# Patient Record
Sex: Male | Born: 1990 | Hispanic: No | Marital: Married | Smoking: Never smoker
Health system: Southern US, Community
[De-identification: ages and names within clinical notes are randomized; demographics above are authoritative.]

## PROBLEM LIST (undated history)

## (undated) DIAGNOSIS — U071 COVID-19: Secondary | ICD-10-CM

## (undated) DIAGNOSIS — R002 Palpitations: Secondary | ICD-10-CM

## (undated) DIAGNOSIS — R7303 Prediabetes: Secondary | ICD-10-CM

## (undated) DIAGNOSIS — R079 Chest pain, unspecified: Secondary | ICD-10-CM

## (undated) HISTORY — DX: Palpitations: R00.2

## (undated) HISTORY — DX: Chest pain, unspecified: R07.9

## (undated) HISTORY — PX: NO PAST SURGERIES: SHX2092

## (undated) HISTORY — DX: Prediabetes: R73.03

---

## 2019-11-29 ENCOUNTER — Other Ambulatory Visit: Payer: Self-pay

## 2019-11-29 ENCOUNTER — Encounter (HOSPITAL_COMMUNITY): Payer: Self-pay

## 2019-11-29 ENCOUNTER — Emergency Department (HOSPITAL_COMMUNITY)
Admission: EM | Admit: 2019-11-29 | Discharge: 2019-11-29 | Disposition: A | Discharge status: Home / Self Care | Payer: Managed Care, Other (non HMO) | Attending: Emergency Medicine | Admitting: Emergency Medicine

## 2019-11-29 DIAGNOSIS — Y939 Activity, unspecified: Secondary | ICD-10-CM | POA: Diagnosis not present

## 2019-11-29 DIAGNOSIS — Y92410 Unspecified street and highway as the place of occurrence of the external cause: Secondary | ICD-10-CM | POA: Insufficient documentation

## 2019-11-29 DIAGNOSIS — S161XXA Strain of muscle, fascia and tendon at neck level, initial encounter: Secondary | ICD-10-CM | POA: Diagnosis not present

## 2019-11-29 DIAGNOSIS — Y999 Unspecified external cause status: Secondary | ICD-10-CM | POA: Insufficient documentation

## 2019-11-29 DIAGNOSIS — S199XXA Unspecified injury of neck, initial encounter: Secondary | ICD-10-CM | POA: Diagnosis present

## 2019-11-29 MED ORDER — CYCLOBENZAPRINE HCL 10 MG PO TABS
10.0000 mg | ORAL_TABLET | Freq: Two times a day (BID) | ORAL | 0 refills | Status: DC | PRN
Start: 2019-11-29 — End: 2020-03-14

## 2019-11-29 NOTE — Discharge Instructions (Signed)
Please read instructions below. °Apply ice to your areas of pain for 20 minutes at a time. °You can take 600 mg of Advil/ibuprofen every 6 hours as needed for pain. °You can take flexeril every 12 hours as needed for muscle spasm. Be aware this medication can make you drowsy. °Schedule an appointment with your primary care provider to follow up on your visit today. °Return to the ER for severely worsening headache, vision changes, if new numbness or weakness in your arms or legs, inability to urinate, inability to hold your bowels, or concerning symptoms. ° °
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
## 2020-01-18 ENCOUNTER — Ambulatory Visit: Payer: Self-pay | Admitting: *Deleted

## 2020-01-18 NOTE — Telephone Encounter (Signed)
Pt's wife calling, pt present. States dx with covid Saturday. Reports lightheadedness when standing. States BP, HR SAT "All normal." Reports lightheadedness mild, "After he was out mowing." Discussed S/S dehydration, symptom tier, when to go to ED. Care advise given per protocl. Verbalizes understanding.  Reason for Disposition . [1] COVID-19 diagnosed by positive lab test AND [2] mild symptoms (e.g., cough, fever, others) AND [3] no complications or SOB  Answer Assessment - Initial Assessment Questions 1. COVID-19 DIAGNOSIS: "Who made your Coronavirus (COVID-19) diagnosis?" "Was it confirmed by a positive lab test?" If not diagnosed by a HCP, ask "Are there lots of cases (community spread) where you live?" (See public health department website, if unsure)     *No Answer* 2. COVID-19 EXPOSURE: "Was there any known exposure to COVID before the symptoms began?" CDC Definition of close contact: within 6 feet (2 meters) for a total of 15 minutes or more over a 24-hour period.      *No Answer* 3. ONSET: "When did the COVID-19 symptoms start?"      *No Answer* 4. WORST SYMPTOM: "What is your worst symptom?" (e.g., cough, fever, shortness of breath, muscle aches)     *No Answer* 5. COUGH: "Do you have a cough?" If Yes, ask: "How bad is the cough?"       *No Answer* 6. FEVER: "Do you have a fever?" If Yes, ask: "What is your temperature, how was it measured, and when did it start?"     *No Answer* 7. RESPIRATORY STATUS: "Describe your breathing?" (e.g., shortness of breath, wheezing, unable to speak)      *No Answer* 8. BETTER-SAME-WORSE: "Are you getting better, staying the same or getting worse compared to yesterday?"  If getting worse, ask, "In what way?"     *No Answer* 9. HIGH RISK DISEASE: "Do you have any chronic medical problems?" (e.g., asthma, heart or lung disease, weak immune system, obesity, etc.)     *No Answer* 10. PREGNANCY: "Is there any chance you are pregnant?" "When was your last  menstrual period?"       *No Answer* 11. OTHER SYMPTOMS: "Do you have any other symptoms?"  (e.g., chills, fatigue, headache, loss of smell or taste, muscle pain, sore throat; new loss of smell or taste especially support the diagnosis of COVID-19)       *No Answer*  Protocols used: CORONAVIRUS (COVID-19) DIAGNOSED OR SUSPECTED-A-AH

## 2020-01-22 ENCOUNTER — Other Ambulatory Visit: Payer: Self-pay

## 2020-01-22 ENCOUNTER — Emergency Department (HOSPITAL_COMMUNITY)
Admission: EM | Admit: 2020-01-22 | Discharge: 2020-01-23 | Disposition: A | Discharge status: Home / Self Care | Payer: Managed Care, Other (non HMO) | Attending: Emergency Medicine | Admitting: Emergency Medicine

## 2020-01-22 DIAGNOSIS — R0602 Shortness of breath: Secondary | ICD-10-CM | POA: Diagnosis present

## 2020-01-22 DIAGNOSIS — U071 COVID-19: Secondary | ICD-10-CM | POA: Diagnosis not present

## 2020-01-22 DIAGNOSIS — R002 Palpitations: Secondary | ICD-10-CM

## 2020-01-22 NOTE — ED Triage Notes (Signed)
PT  Robert Copeland he was dx with covid last Wednesday and tonight he felt like his heart was  racing. No SOB. Pt said he did smoke tonight and felt his heart beating fast. No SOB. Some chest pressure, no fevers, no cough

## 2020-01-23 ENCOUNTER — Emergency Department (HOSPITAL_COMMUNITY): Payer: Managed Care, Other (non HMO)

## 2020-01-23 LAB — D-DIMER, QUANTITATIVE: D-Dimer, Quant: <0.27 ug/mL-FEU (ref 0.00–0.50)

## 2020-01-23 LAB — CBC
HCT: 51.1 % (ref 39.0–52.0)
Hemoglobin: 16.2 g/dL (ref 13.0–17.0)
MCH: 27 pg (ref 26.0–34.0)
MCHC: 31.7 g/dL (ref 30.0–36.0)
MCV: 85 fL (ref 80.0–100.0)
Platelets: 233 10*3/uL (ref 150–400)
RBC: 6.01 MIL/uL — ABNORMAL HIGH (ref 4.22–5.81)
RDW: 12.9 % (ref 11.5–15.5)
WBC: 8.8 10*3/uL (ref 4.0–10.5)
nRBC: 0 % (ref 0.0–0.2)

## 2020-01-23 LAB — BASIC METABOLIC PANEL
Anion gap: 12 (ref 5–15)
BUN: 7 mg/dL (ref 6–20)
CO2: 26 mmol/L (ref 22–32)
Calcium: 8.9 mg/dL (ref 8.9–10.3)
Chloride: 95 mmol/L — ABNORMAL LOW (ref 98–111)
Creatinine, Ser: 1.45 mg/dL — ABNORMAL HIGH (ref 0.61–1.24)
GFR calc Af Amer: >60 mL/min (ref >60)
GFR calc non Af Amer: >60 mL/min (ref >60)
Glucose, Bld: 160 mg/dL — ABNORMAL HIGH (ref 70–99)
Potassium: 3.3 mmol/L — ABNORMAL LOW (ref 3.5–5.1)
Sodium: 133 mmol/L — ABNORMAL LOW (ref 135–145)

## 2020-01-23 LAB — TROPONIN I (HIGH SENSITIVITY)
Troponin I (High Sensitivity): 6 ng/L (ref <18)
Troponin I (High Sensitivity): 6 ng/L (ref <18)

## 2020-01-23 MED ORDER — KETOROLAC TROMETHAMINE 30 MG/ML IJ SOLN: 30.0000 mg | Freq: Once | INTRAMUSCULAR | Status: DC

## 2020-01-23 MED ORDER — SODIUM CHLORIDE 0.9 % IV BOLUS
1000.0000 mL | Freq: Once | INTRAVENOUS | Status: AC
  Administered 2020-01-23: 1000 mL via INTRAVENOUS

## 2020-01-23 NOTE — ED Provider Notes (Addendum)
[facility] EMERGENCY DEPARTMENT Provider Note   CSN: [id number] Arrival date & time: 01/22/20  2332     History Chief Complaint  Patient presents with  . Shortness of Breath    COVID +  . Chest Pain    Robert Copeland is a 29 y.o. male.  HPI   tested positive for covid on the 26th of August - unvaccinated No cough / sob / cp n/v/d. 2 episodes of "low grade fever" - took OTC's. No myalgias / weakness / abd pain. He has some constipation  Presents today after smoking MJ last night - had some palpitations - has resolved at this point.  Had R sided pressure on the chest.  palpitations have resolved.  Patient states that he will no longer smoke marijuana, he states that he has had a decreased appetite since being diagnosed with Covid approximately 10 days ago, he has lost approximately 16 pounds during that timeframe.  He is now eating again over the last couple of days but while in the waiting room had very little, only water    No past medical history on file.  There are no problems to display for this patient.   No past surgical history on file.     No family history on file.  Social History   Tobacco Use  . Smoking status: Not on file  Substance Use Topics  . Alcohol use: Not on file  . Drug use: Not on file    Home Medications Prior to Admission medications   Medication Sig Start Date End Date Taking? Authorizing Provider  cyclobenzaprine (FLEXERIL) 10 MG tablet Take 1 tablet (10 mg total) by mouth 2 (two) times daily as needed for muscle spasms. 11/29/19   Robinson, [place] N, PA-C    Allergies    Patient has no allergy information on record.  Review of Systems   Review of Systems  All other systems reviewed and are negative.   Physical Exam Updated Vital Signs BP 131/82   Pulse 81   Temp 98.8 F (37.1 C) (Oral)   Resp 16   SpO2 100%   Physical Exam Vitals and nursing note reviewed.  Constitutional:      General: He is not  in acute distress.    Appearance: He is well-developed.  HENT:     Head: Normocephalic and atraumatic.     Mouth/Throat:     Pharynx: No oropharyngeal exudate.  Eyes:     General: No scleral icterus.       Right eye: No discharge.        Left eye: No discharge.     Conjunctiva/sclera: Conjunctivae normal.     Pupils: Pupils are equal, round, and reactive to light.  Neck:     Thyroid: No thyromegaly.     Vascular: No JVD.  Cardiovascular:     Rate and Rhythm: Regular rhythm. Tachycardia present.     Heart sounds: Normal heart sounds. No murmur heard.  No friction rub. No gallop.      Comments: 105 ish Pulmonary:     Effort: Pulmonary effort is normal. No respiratory distress.     Breath sounds: Normal breath sounds. No wheezing or rales.  Chest:     Chest wall: No tenderness.  Abdominal:     General: Bowel sounds are normal. There is no distension.     Palpations: Abdomen is soft. There is no mass.     Tenderness: There is no abdominal tenderness.  Musculoskeletal:  General: No tenderness. Normal range of motion.     Cervical back: Normal range of motion and neck supple.     Right lower leg: No edema.     Left lower leg: No edema.  Lymphadenopathy:     Cervical: No cervical adenopathy.  Skin:    General: Skin is warm and dry.     Findings: No erythema or rash.  Neurological:     Mental Status: He is alert.     Coordination: Coordination normal.  Psychiatric:        Behavior: Behavior normal.     ED Results / Procedures / Treatments   Labs (all labs ordered are listed, but only abnormal results are displayed) Labs Reviewed  BASIC METABOLIC PANEL - Abnormal; Notable for the following components:      Result Value   Sodium 133 (*)    Potassium 3.3 (*)    Chloride 95 (*)    Glucose, Bld 160 (*)    Creatinine, Ser 1.45 (*)    All other components within normal limits  CBC - Abnormal; Notable for the following components:   RBC 6.01 (*)    All other  components within normal limits  D-DIMER, QUANTITATIVE (NOT AT [facility])  TROPONIN I (HIGH SENSITIVITY)  TROPONIN I (HIGH SENSITIVITY)    EKG EKG Interpretation  Date/Time:  Sunday January 23 2020 00:02:58 EDT Ventricular Rate:  108 PR Interval:  164 QRS Duration: 84 QT Interval:  324 QTC Calculation: 434 R Axis:   52 Text Interpretation: Sinus tachycardia T wave abnormality, consider inferior ischemia Abnormal ECG no old tracing to compare Confirmed by Eber Hong ([id number]) on 01/23/2020 11:00:05 AM   EKG Interpretation  Date/Time:  Sunday January 23 2020 11:54:21 EDT Ventricular Rate:  81 PR Interval:  164 QRS Duration: 85 QT Interval:  369 QTC Calculation: 429 R Axis:   52 Text Interpretation: Sinus rhythm Borderline T abnormalities, inferior leads Borderline ST elevation, anterior leads Since last tracing rate slower repolarization changes seen on EKG Confirmed by Eber Hong ([id number]) on 01/23/2020 12:02:33 PM        Radiology DG Chest 2 View  Result Date: 01/23/2020 CLINICAL DATA:  Chest pain EXAM: CHEST - 2 VIEW COMPARISON:  None. FINDINGS: The heart size and mediastinal contours are within normal limits. Both lungs are clear. The visualized skeletal structures are unremarkable. IMPRESSION: Normal study. Electronically Signed   By: Charlett Nose M.D.   On: 01/23/2020 00:34    Procedures .Marland KitchenLaceration Repair  Date/Time: 01/23/2020 1:28 PM Performed by: Eber Hong, MD Authorized by: Eber Hong, MD   Comments:       Marland KitchenMarland KitchenLaceration Repair  Date/Time: 01/23/2020 1:29 PM Performed by: Eber Hong, MD Authorized by: Eber Hong, MD   Comments:         (including critical care time)  Medications Ordered in ED Medications  sodium chloride 0.9 % bolus 1,000 mL (1,000 mLs Intravenous New Bag/Given 01/23/20 1331)    ED Course  I have reviewed the triage vital signs and the nursing notes.  Pertinent labs & imaging results that were available during my care of  the patient were reviewed by me and considered in my medical decision making (see chart for details).    MDM Rules/Calculators/A&P                          The patient's exam is rather unremarkable, he is not tachycardic on my exam, his lab work has  been reviewed and shows that he does have a slight renal insufficiency with a creatinine of 1.45, slight electrolyte abnormalities.  His blood sugar is elevated and should not be given his lack of sugar intake while he has been here.  We will proceed with an IV fluids, he will need to follow-up in the outpatient setting, he has an abnormal EKG which we will repeat but no signs of tacky arrhythmias at this time.  His significant other states that his heart rate was approximately 165 as seen on a pulse ox machine which she correlated with palpating his heart rate clinically.  He will most certainly need follow-up with cardiology given the EKG and the palpitations though I do not see any signs of a accessory pathway or tacky arrhythmia on the EKG.  He has been made aware of all of his lab abnormalities, I have instructed him to find a family doctor and have his labs repeated including a fasting glucose.  Given the slight change in ST segments there could be an element of pericarditis though there does not appear to be any signs of acute ischemia.  Again the patient will need to be referred to cardiology.  Second EKG repeated has significant improvement in the abnormal T waves, the diffuse ST changes especially through the precordium which do not appear ischemic do suggest a possible element of pericarditis or just an early repol.  IVF given, pt stable for d/c.   To be clear this patient did not have laceration repairs, the electronic medical record did not allow me to remove these procedure notes which were accidentally entered on the wrong chart.  Information was deleted, this patient should not receive a charge for laceration repairs  Final Clinical  Impression(s) / ED Diagnoses Final diagnoses:  Palpitations      Eber Hong, MD 01/23/20 1342    Eber Hong, MD 01/23/20 1435

## 2020-01-23 NOTE — Discharge Instructions (Addendum)
Your testing has been reassuring, it does appear that you are a little bit dehydrated, please see the phone number on the paperwork to arrange a follow-up appointment with a local family doctor.  You should also be seen by a heart doctor since you are having the palpitations or feeling like your heart was racing.  I would recommend that you see them within the week  Return to the emergency department immediately for severe worsening symptoms  Please avoid any substances that contain caffeine or other stimulants, this may include smoking marijuana, tobacco, alcohol or over-the-counter products such as antihistamines, allergy medicines, cough medications or cough and cold products.

## 2020-01-28 ENCOUNTER — Encounter (HOSPITAL_COMMUNITY): Payer: Self-pay

## 2020-01-28 ENCOUNTER — Ambulatory Visit (HOSPITAL_COMMUNITY)
Admission: EM | Admit: 2020-01-28 | Discharge: 2020-01-28 | Disposition: A | Discharge status: Home / Self Care | Payer: Managed Care, Other (non HMO) | Attending: Physician Assistant | Admitting: Physician Assistant

## 2020-01-28 ENCOUNTER — Other Ambulatory Visit: Payer: Self-pay

## 2020-01-28 DIAGNOSIS — R0981 Nasal congestion: Secondary | ICD-10-CM

## 2020-01-28 DIAGNOSIS — R079 Chest pain, unspecified: Secondary | ICD-10-CM | POA: Diagnosis not present

## 2020-01-28 MED ORDER — FLUTICASONE PROPIONATE 50 MCG/ACT NA SUSP
1.0000 | Freq: Every day | NASAL | 2 refills | Status: DC
Start: 2020-01-28 — End: 2020-03-14

## 2020-01-28 MED ORDER — FAMOTIDINE 20 MG PO TABS
20.0000 mg | ORAL_TABLET | Freq: Two times a day (BID) | ORAL | 0 refills | Status: DC
Start: 2020-01-28 — End: 2020-03-14

## 2020-01-28 NOTE — ED Triage Notes (Signed)
Patient presents to Urgent Care with complaints of generalized chest pressure since about 10 days ago. Patient reports he had covid recently, at the end of his quarantine the chest pain worried him so much he went to the ED for evaluation. States it feels like a gas bubble comes up from his stomach and burns.  Pt states it makes him feel like is blood pressure is rising, no hx of htn. Pt states the pain first started 10 days ago after smoking some weed while on a walk down the street.

## 2020-01-28 NOTE — ED Provider Notes (Signed)
[facility]    CSN: [id number] Arrival date & time: 01/28/20  1533      History   Chief Complaint Chief Complaint  Patient presents with  . Chest Pain    HPI Robert Copeland is a 29 y.o. male.   Patient reports for right-sided chest pressure/discomfort reports this is been going on for several days to over a week now.  He reports this all started after he had recovered from Covid.  He reports he went to the emergency department on 01/22/2020 for same.  He reports he had smoked some marijuana and started to feel his heart racing and developed some chest pressure.  He reports they worked him up and did not find anything.  He reports since then he has continued to have this "bubble" pressure in the right side of his chest.  States it feels like bloating and then he might need to belch but has not belched.  Occasionally worse when laying down reports occasionally feeling somewhat nauseous with this but no vomiting.  He reports he is unsure whether food makes it better or worse, water made it feel different today.  He denies radiation of this into his left chest or neck or jaw.  He denies any shortness of breath.  He reports he has not felt palpitations in a few days.  No lightheadedness or dizziness noted.  Denies any abdominal pain.  Moving his bowels, does note having possible issues with constipation try MiraLAX for this week.  Moving his bowels better now.  Had a bowel movement yesterday.  Denies any fevers.  No coughing or other respiratory symptoms.  States he has pending appointments for primary care and cardiology.   States he developed some nasal congestion.  He was worried about what medicines he could use given his symptoms.     History reviewed. No pertinent past medical history.  There are no problems to display for this patient.   History reviewed. No pertinent surgical history.     Home Medications    Prior to Admission medications   Medication Sig Start  Date End Date Taking? Authorizing Provider  cyclobenzaprine (FLEXERIL) 10 MG tablet Take 1 tablet (10 mg total) by mouth 2 (two) times daily as needed for muscle spasms. 11/29/19   Robinson, [place] N, PA-C  famotidine (PEPCID) 20 MG tablet Take 1 tablet (20 mg total) by mouth 2 (two) times daily. 01/28/20   Reznor Ferrando, Veryl Speak, PA-C  fluticasone (FLONASE) 50 MCG/ACT nasal spray Place 1 spray into both nostrils daily. 01/28/20   Alverda Nazzaro, Veryl Speak, PA-C    Family History Family History  Problem Relation Age of Onset  . Healthy Mother   . Healthy Father     Social History Social History   Tobacco Use  . Smoking status: Never Smoker  . Smokeless tobacco: Never Used  Substance Use Topics  . Alcohol use: Yes  . Drug use: Yes    Types: Marijuana     Allergies   Patient has no known allergies.   Review of Systems Review of Systems   Physical Exam Triage Vital Signs ED Triage Vitals  Enc Vitals Group     BP 01/28/20 1648 (!) 142/87     Pulse Rate 01/28/20 1648 79     Resp 01/28/20 1648 16     Temp 01/28/20 1648 98.8 F (37.1 C)     Temp Source 01/28/20 1648 Oral     SpO2 01/28/20 1648 99 %  Weight --      Height --      Head Circumference --      Peak Flow --      Pain Score 01/28/20 1645 4     Pain Loc --      Pain Edu? --      Excl. in GC? --    No data found.  Updated Vital Signs BP (!) 142/87 (BP Location: Right Arm)   Pulse 79   Temp 98.8 F (37.1 C) (Oral)   Resp 16   SpO2 99%   Visual Acuity Right Eye Distance:   Left Eye Distance:   Bilateral Distance:    Right Eye Near:   Left Eye Near:    Bilateral Near:     Physical Exam Vitals and nursing note reviewed.  Constitutional:      General: He is not in acute distress.    Appearance: He is well-developed. He is not ill-appearing.  HENT:     Head: Normocephalic and atraumatic.     Nose: Congestion present.     Mouth/Throat:     Mouth: Mucous membranes are moist.     Pharynx: Oropharynx is clear.    Eyes:     Conjunctiva/sclera: Conjunctivae normal.  Cardiovascular:     Rate and Rhythm: Normal rate and regular rhythm.     Heart sounds: No murmur heard.   Pulmonary:     Effort: Pulmonary effort is normal. No respiratory distress.     Breath sounds: Normal breath sounds. No decreased breath sounds, wheezing, rhonchi or rales.  Chest:     Chest wall: No tenderness.  Abdominal:     Palpations: Abdomen is soft.     Tenderness: There is no abdominal tenderness.  Musculoskeletal:     Cervical back: Neck supple.     Right lower leg: No edema.     Left lower leg: No edema.  Skin:    General: Skin is warm and dry.  Neurological:     Mental Status: He is alert.      UC Treatments / Results  Labs (all labs ordered are listed, but only abnormal results are displayed) Labs Reviewed - No data to display  EKG   Radiology No results found.  Procedures Procedures (including critical care time)  Medications Ordered in UC Medications - No data to display  Initial Impression / Assessment and Plan / UC Course  I have reviewed the triage vital signs and the nursing notes.  Pertinent labs & imaging results that were available during my care of the patient were reviewed by me and considered in my medical decision making (see chart for details).     #Nonspecific chest pain #Nasal congestion Patient is a 29 year old presenting with nonspecific chest discomfort and nasal congestion.  Reassuring work-up in the emergency department on 01/23/2020.  Reassuring EKG, chest x-ray, troponins, negative D-dimer.  Mild electrolyte abnormalities, thought to not be causing symptoms.  Patient has a regular rhythm on exam today.  I do wonder if there is some aspect of reflux given describes symptoms will trial Pepcid.  Given patient's recent Covid, would not retest today.  Recommend Flonase for nasal congestion.  Discussed strict emergency department precautions.  Encouraged him to contact primary care  office in cardiology office to see if he can be seen sooner.  Has cardiology follow-up on 02/24/2019 when scheduled.  Primary care follow-up October 15 of encouraged to attempt earlier follow-up.  Patient verbalized agreement understanding plan of care. Final  Clinical Impressions(s) / UC Diagnoses   Final diagnoses:  Nonspecific chest pain  Nasal congestion     Discharge Instructions     I am not entirely sure what is causing your symptoms, but you had a reassuring work up at the ER and I do not see dangerous things today  Try the pepcid Use flonase  Call your Primary care Monday for close follow up  If symptoms become severe again with fast heart rate, shortness of breath, dizziness go to the Emergency department     ED Prescriptions    Medication Sig Dispense Auth. Provider   famotidine (PEPCID) 20 MG tablet Take 1 tablet (20 mg total) by mouth 2 (two) times daily. 30 tablet Salina Stanfield, Veryl Speak, PA-C   fluticasone (FLONASE) 50 MCG/ACT nasal spray Place 1 spray into both nostrils daily. 15.8 mL Sevannah Madia, Veryl Speak, PA-C     PDMP not reviewed this encounter.   Hermelinda Medicus, PA-C 01/28/20 2321

## 2020-01-28 NOTE — Discharge Instructions (Signed)
I am not entirely sure what is causing your symptoms, but you had a reassuring work up at the ER and I do not see dangerous things today  Try the pepcid Use flonase  Call your Primary care Monday for close follow up  If symptoms become severe again with fast heart rate, shortness of breath, dizziness go to the Emergency department

## 2020-02-03 NOTE — Progress Notes (Signed)
Electrophysiology Office Note:    Date:  02/04/2020   ID:  Robert Copeland, DOB [birth year], MRN [id number]  PCP:  Patient, No Pcp Per  [facility] Cardiologist:  No primary care provider on file.  [facility] Electrophysiologist:  None   Referring MD: No ref. provider found   Chief Complaint: Pericarditic pain  History of Present Illness:    Robert Copeland is a 29 y.o. male who presents for an evaluation of chest pain at the request of Cam Hai, PA-C . He recently had covid in the end of August. Since then, he had chest pain that is sharp and sometimes radiates to the back and shoulder blade. It is not always present but has been severe enough to prompt an ER and urgent care visit. No syncope or presyncope.  Past Medical History:  Diagnosis Date  . Chest pain   . Palpitations     No past surgical history on file.  Current Medications: Current Meds  Medication Sig  . cyclobenzaprine (FLEXERIL) 10 MG tablet Take 1 tablet (10 mg total) by mouth 2 (two) times daily as needed for muscle spasms.  . famotidine (PEPCID) 20 MG tablet Take 1 tablet (20 mg total) by mouth 2 (two) times daily.  . fluticasone (FLONASE) 50 MCG/ACT nasal spray Place 1 spray into both nostrils daily.     Allergies:   Patient has no known allergies.   Social History   Socioeconomic History  . Marital status: Married    Spouse name: Not on file  . Number of children: Not on file  . Years of education: Not on file  . Highest education level: Not on file  Occupational History  . Not on file  Tobacco Use  . Smoking status: Never Smoker  . Smokeless tobacco: Never Used  Substance and Sexual Activity  . Alcohol use: Yes  . Drug use: Yes    Types: Marijuana  . Sexual activity: Not on file  Other Topics Concern  . Not on file  Social History Narrative  . Not on file   Social Determinants of Health   Financial Resource Strain:   . Difficulty of Paying Living Expenses: Not on file  Food  Insecurity:   . Worried About Programme researcher, broadcasting/film/video in the Last Year: Not on file  . Ran Out of Food in the Last Year: Not on file  Transportation Needs:   . Lack of Transportation (Medical): Not on file  . Lack of Transportation (Non-Medical): Not on file  Physical Activity:   . Days of Exercise per Week: Not on file  . Minutes of Exercise per Session: Not on file  Stress:   . Feeling of Stress : Not on file  Social Connections:   . Frequency of Communication with Friends and Family: Not on file  . Frequency of Social Gatherings with Friends and Family: Not on file  . Attends Religious Services: Not on file  . Active Member of Clubs or Organizations: Not on file  . Attends Banker Meetings: Not on file  . Marital Status: Not on file     Family History: The patient's family history includes Healthy in his father and mother.  ROS:   Please see the history of present illness.    All other systems reviewed and are negative.  EKGs/Labs/Other Studies Reviewed:    The following studies were reviewed today: ECG  EKG:  The ekg ordered today demonstrates sinus rhythm.   Recent Labs: 01/23/2020:  BUN 7; Creatinine, Ser 1.45; Hemoglobin 16.2; Platelets 233; Potassium 3.3; Sodium 133  Recent Lipid Panel No results found for: CHOL, TRIG, HDL, CHOLHDL, VLDL, LDLCALC, LDLDIRECT  Physical Exam:    VS:  BP 126/74   Pulse (!) 102   Ht 5\' 7"  (1.702 m)   Wt 184 lb (83.5 kg)   SpO2 96%   BMI 28.82 kg/m     Wt Readings from Last 3 Encounters:  02/04/20 184 lb (83.5 kg)     GEN:  Well nourished, well developed in no acute distress HEENT: Normal NECK: No JVD; No carotid bruits LYMPHATICS: No lymphadenopathy CARDIAC: RRR, no murmurs, rubs, gallops. RESPIRATORY:  Clear to auscultation without rales, wheezing or rhonchi  ABDOMEN: Soft, non-tender, non-distended MUSCULOSKELETAL:  No edema; No deformity  SKIN: Warm and dry NEUROLOGIC:  Alert and oriented x 3 PSYCHIATRIC:   Normal affect   ASSESSMENT:    1. Viral pericarditis, unspecified chronicity    PLAN:    In order of problems listed above:  1. Viral pericarditis Likely related to covid19 infection. I have recommended he take ibuprofen 600mg  twice daily with food for 7 days. I am also ordering a surface echo to confirm no structural abnormalities or significant effusion. Follow up 6 weeks.   Medication Adjustments/Labs and Tests Ordered: Current medicines are reviewed at length with the patient today.  Concerns regarding medicines are outlined above.  Orders Placed This Encounter  Procedures  . EKG 12-Lead  . ECHOCARDIOGRAM COMPLETE   Meds ordered this encounter  Medications  . Ibuprofen 200 MG CAPS    Sig: Take 3 capsules (600 mg total) by mouth in the morning and at bedtime for 7 days.    Dispense:  42 capsule    Refill:  0     Signed, 02/06/20, MD, [facility]  02/04/2020 3:40 PM    Electrophysiology [facility]

## 2020-02-04 ENCOUNTER — Ambulatory Visit (INDEPENDENT_AMBULATORY_CARE_PROVIDER_SITE_OTHER): Payer: Managed Care, Other (non HMO) | Admitting: Cardiology

## 2020-02-04 ENCOUNTER — Other Ambulatory Visit: Payer: Self-pay

## 2020-02-04 VITALS — BP 126/74 | HR 102 | Ht 67.0 in | Wt 184.0 lb

## 2020-02-04 DIAGNOSIS — B3323 Viral pericarditis: Secondary | ICD-10-CM

## 2020-02-04 DIAGNOSIS — R002 Palpitations: Secondary | ICD-10-CM | POA: Insufficient documentation

## 2020-02-04 MED ORDER — IBUPROFEN 200 MG PO CAPS: 600.0000 mg | ORAL_CAPSULE | Freq: Two times a day (BID) | ORAL | 0 refills | Status: AC

## 2020-02-04 NOTE — Patient Instructions (Addendum)
Medication Instructions:  Your physician has recommended you make the following change in your medication:   1.  Take IBUPROFEN 200 mg capsules-  Take 3 capsules (600 mg) by mouth twice a day for 7 days.  Lab Work: None ordered. If you have labs (blood work) drawn today and your tests are completely normal, you will receive your results only by: Marland Kitchen MyChart Message (if you have MyChart) OR . A paper copy in the mail If you have any lab test that is abnormal or we need to change your treatment, we will call you to review the results.  Testing/Procedures: Your physician has requested that you have an echocardiogram. Echocardiography is a painless test that uses sound waves to create images of your heart. It provides your doctor with information about the size and shape of your heart and how well your heart's chambers and valves are working. This procedure takes approximately one hour. There are no restrictions for this procedure.  Please schedule echo   Follow-Up: At [facility], you and your health needs are our priority.  As part of our continuing mission to provide you with exceptional heart care, we have created designated Provider Care Teams.  These Care Teams include your primary Cardiologist (physician) and Advanced Practice Providers (APPs -  Physician Assistants and Nurse Practitioners) who all work together to provide you with the care you need, when you need it.  Your next appointment:   Your physician wants you to follow-up in: 6 weeks with Dr. Lalla Brothers.

## 2020-02-16 ENCOUNTER — Ambulatory Visit: Payer: Managed Care, Other (non HMO) | Admitting: Cardiology

## 2020-02-23 ENCOUNTER — Other Ambulatory Visit: Payer: Self-pay

## 2020-02-23 ENCOUNTER — Ambulatory Visit (HOSPITAL_COMMUNITY): Payer: Managed Care, Other (non HMO) | Attending: Cardiovascular Disease

## 2020-02-23 DIAGNOSIS — B3323 Viral pericarditis: Secondary | ICD-10-CM | POA: Diagnosis not present

## 2020-02-23 LAB — ECHOCARDIOGRAM COMPLETE
Area-P 1/2: 5.06 cm2
S' Lateral: 3.2 cm

## 2020-02-24 ENCOUNTER — Ambulatory Visit: Payer: Managed Care, Other (non HMO) | Admitting: Cardiology

## 2020-03-03 ENCOUNTER — Other Ambulatory Visit: Payer: Self-pay

## 2020-03-03 ENCOUNTER — Ambulatory Visit (INDEPENDENT_AMBULATORY_CARE_PROVIDER_SITE_OTHER): Payer: Managed Care, Other (non HMO) | Admitting: Registered Nurse

## 2020-03-03 ENCOUNTER — Encounter: Payer: Self-pay | Admitting: Registered Nurse

## 2020-03-03 VITALS — BP 123/84 | HR 83 | Temp 98.5°F | Resp 18 | Ht 67.0 in | Wt 187.4 lb

## 2020-03-03 DIAGNOSIS — Z0000 Encounter for general adult medical examination without abnormal findings: Secondary | ICD-10-CM

## 2020-03-03 DIAGNOSIS — Z13228 Encounter for screening for other metabolic disorders: Secondary | ICD-10-CM | POA: Diagnosis not present

## 2020-03-03 DIAGNOSIS — Z7689 Persons encountering health services in other specified circumstances: Secondary | ICD-10-CM

## 2020-03-03 DIAGNOSIS — Z1329 Encounter for screening for other suspected endocrine disorder: Secondary | ICD-10-CM | POA: Diagnosis not present

## 2020-03-03 DIAGNOSIS — Z13220 Encounter for screening for lipoid disorders: Secondary | ICD-10-CM | POA: Diagnosis not present

## 2020-03-03 DIAGNOSIS — Z130 Encounter for screening for diseases of the blood and blood-forming organs and certain disorders involving the immune mechanism: Secondary | ICD-10-CM

## 2020-03-03 LAB — POCT GLYCOSYLATED HEMOGLOBIN (HGB A1C): Hemoglobin A1C: 5.8 % — AB (ref 4.0–5.6)

## 2020-03-03 NOTE — Patient Instructions (Signed)
° ° ° °  If you have lab work done today you will be contacted with your lab results within the next 2 weeks.  If you have not heard from us then please contact us. The fastest way to get your results is to register for My Chart. ° ° °IF you received an x-ray today, you will receive an invoice from [facility]. Please contact [facility] at [phone] with questions or concerns regarding your invoice.  ° °IF you received labwork today, you will receive an invoice from [facility]. Please contact [facility] at [phone] with questions or concerns regarding your invoice.  ° °Our billing staff will not be able to assist you with questions regarding bills from these companies. ° °You will be contacted with the lab results as soon as they are available. The fastest way to get your results is to activate your My Chart account. Instructions are located on the last page of this paperwork. If you have not heard from us regarding the results in 2 weeks, please contact this office. °  ° ° ° °

## 2020-03-04 LAB — COMPREHENSIVE METABOLIC PANEL
ALT: 40 IU/L (ref 0–44)
AST: 23 IU/L (ref 0–40)
Albumin/Globulin Ratio: 1.8 (ref 1.2–2.2)
Albumin: 4.6 g/dL (ref 4.1–5.2)
Alkaline Phosphatase: 53 IU/L (ref 44–121)
BUN/Creatinine Ratio: 8 — ABNORMAL LOW (ref 9–20)
BUN: 8 mg/dL (ref 6–20)
Bilirubin Total: 0.3 mg/dL (ref 0.0–1.2)
CO2: 22 mmol/L (ref 20–29)
Calcium: 9.2 mg/dL (ref 8.7–10.2)
Chloride: 102 mmol/L (ref 96–106)
Creatinine, Ser: 1.02 mg/dL (ref 0.76–1.27)
GFR calc Af Amer: 115 mL/min/{1.73_m2} (ref >59)
GFR calc non Af Amer: 100 mL/min/{1.73_m2} (ref >59)
Globulin, Total: 2.6 g/dL (ref 1.5–4.5)
Glucose: 97 mg/dL (ref 65–99)
Potassium: 4.7 mmol/L (ref 3.5–5.2)
Sodium: 135 mmol/L (ref 134–144)
Total Protein: 7.2 g/dL (ref 6.0–8.5)

## 2020-03-04 LAB — CBC WITH DIFFERENTIAL
Basophils Absolute: 0.1 10*3/uL (ref 0.0–0.2)
Basos: 1 %
EOS (ABSOLUTE): 0.3 10*3/uL (ref 0.0–0.4)
Eos: 5 %
Hematocrit: 46.9 % (ref 37.5–51.0)
Hemoglobin: 15.5 g/dL (ref 13.0–17.7)
Immature Grans (Abs): 0 10*3/uL (ref 0.0–0.1)
Immature Granulocytes: 0 %
Lymphocytes Absolute: 2.7 10*3/uL (ref 0.7–3.1)
Lymphs: 46 %
MCH: 27.7 pg (ref 26.6–33.0)
MCHC: 33 g/dL (ref 31.5–35.7)
MCV: 84 fL (ref 79–97)
Monocytes Absolute: 0.5 10*3/uL (ref 0.1–0.9)
Monocytes: 8 %
Neutrophils Absolute: 2.4 10*3/uL (ref 1.4–7.0)
Neutrophils: 40 %
RBC: 5.59 x10E6/uL (ref 4.14–5.80)
RDW: 13.1 % (ref 11.6–15.4)
WBC: 5.9 10*3/uL (ref 3.4–10.8)

## 2020-03-04 LAB — LIPID PANEL
Chol/HDL Ratio: 3.1 ratio (ref 0.0–5.0)
Cholesterol, Total: 165 mg/dL (ref 100–199)
HDL: 54 mg/dL (ref >39)
LDL Chol Calc (NIH): 100 mg/dL — ABNORMAL HIGH (ref 0–99)
Triglycerides: 56 mg/dL (ref 0–149)
VLDL Cholesterol Cal: 11 mg/dL (ref 5–40)

## 2020-03-04 LAB — TSH: TSH: 1.07 u[IU]/mL (ref 0.450–4.500)

## 2020-03-06 ENCOUNTER — Other Ambulatory Visit: Payer: Self-pay | Admitting: Registered Nurse

## 2020-03-06 NOTE — Telephone Encounter (Signed)
Medication Refill - Medication:   Pantoprazole (Patient recently had appointment and was advised by pcp that medication would be sent to pharmacy. Patient contacted pharmacy and medication has not been sent over.)   Has the patient contacted their pharmacy?yes (Agent: If no, request that the patient contact the pharmacy for the refill.) (Agent: If yes, when and what did the pharmacy advise?)Contact PCP  Preferred Pharmacy (with phone number or street name):  [facility] [address] [place]), [place] - [id number] Phone:  [phone]  Fax:  [phone]       Agent: Please be advised that RX refills may take up to 3 business days. We ask that you follow-up with your pharmacy.

## 2020-03-06 NOTE — Telephone Encounter (Signed)
Please send pantoprazole if applicable per pt request per recent visit.

## 2020-03-10 NOTE — Telephone Encounter (Signed)
IS this medication okay to send?

## 2020-03-10 NOTE — Telephone Encounter (Signed)
Patient is calling back regarding  this request  ° °Please advise  °

## 2020-03-13 MED ORDER — PANTOPRAZOLE SODIUM 40 MG PO TBEC
40.0000 mg | DELAYED_RELEASE_TABLET | Freq: Every day | ORAL | 3 refills | Status: DC
Start: 2020-03-13 — End: 2021-03-02

## 2020-03-13 NOTE — Telephone Encounter (Signed)
Rx pended. Please confirm and sign if that is the correct dose you want pt to have.

## 2020-03-13 NOTE — Telephone Encounter (Signed)
Ok to send - disp 90 with 3 refills  Thanks  Jari Sportsman, NP

## 2020-03-14 ENCOUNTER — Encounter: Payer: Self-pay | Admitting: Cardiology

## 2020-03-14 ENCOUNTER — Ambulatory Visit (INDEPENDENT_AMBULATORY_CARE_PROVIDER_SITE_OTHER): Payer: Managed Care, Other (non HMO) | Admitting: Cardiology

## 2020-03-14 ENCOUNTER — Other Ambulatory Visit: Payer: Self-pay

## 2020-03-14 VITALS — BP 122/74 | HR 75 | Ht 66.0 in | Wt 197.0 lb

## 2020-03-14 DIAGNOSIS — B3323 Viral pericarditis: Secondary | ICD-10-CM

## 2020-03-14 NOTE — Progress Notes (Signed)
Electrophysiology Office Follow up Visit Note:    Date:  03/14/2020   ID:  Robert Copeland, DOB 1991-01-09, MRN [id number]  PCP:  Janeece Agee, NP  [facility] Cardiologist:  No primary care provider on file.  [facility] Electrophysiologist:  Lanier Prude, MD    Interval History:    Robert Copeland is a 29 y.o. male who presents for a follow up visit. They were last seen in clinic February 04, 2020 for pericardial chest pain.  He was treated with a 7-day course of ibuprofen and the symptoms resolved.  He tells me he has not had further episodes of this chest discomfort.   Past Medical History:  Diagnosis Date   Chest pain    Palpitations     History reviewed. No pertinent surgical history.  Current Medications: Current Meds  Medication Sig   pantoprazole (PROTONIX) 40 MG tablet Take 1 tablet (40 mg total) by mouth daily.     Allergies:   Patient has no known allergies.   Social History   Socioeconomic History   Marital status: Married    Spouse name: Not on file   Number of children: Not on file   Years of education: Not on file   Highest education level: Not on file  Occupational History   Not on file  Tobacco Use   Smoking status: Former Smoker   Smokeless tobacco: Never Used  Substance and Sexual Activity   Alcohol use: Not Currently   Drug use: Not Currently   Sexual activity: Not on file  Other Topics Concern   Not on file  Social History Narrative   Not on file   Social Determinants of Health   Financial Resource Strain:    Difficulty of Paying Living Expenses: Not on file  Food Insecurity:    Worried About Running Out of Food in the Last Year: Not on file   Ran Out of Food in the Last Year: Not on file  Transportation Needs:    Lack of Transportation (Medical): Not on file   Lack of Transportation (Non-Medical): Not on file  Physical Activity:    Days of Exercise per Week: Not on file   Minutes of  Exercise per Session: Not on file  Stress:    Feeling of Stress : Not on file  Social Connections:    Frequency of Communication with Friends and Family: Not on file   Frequency of Social Gatherings with Friends and Family: Not on file   Attends Religious Services: Not on file   Active Member of Clubs or Organizations: Not on file   Attends Banker Meetings: Not on file   Marital Status: Not on file     Family History: The patient's family history includes Healthy in his father and mother; Hypertension in his mother.  ROS:   Please see the history of present illness.    All other systems reviewed and are negative.  EKGs/Labs/Other Studies Reviewed:    The following studies were reviewed today: Echo  February 23, 2020 echo personally reviewed Left ventricular function normal No significant valvular abnormalities Trivial pericardial effusion  EKG:  The ekg ordered today demonstrates sinus rhythm and early repolarization  Recent Labs: 01/23/2020: Platelets 233 03/03/2020: ALT 40; BUN 8; Creatinine, Ser 1.02; Hemoglobin 15.5; Potassium 4.7; Sodium 135; TSH 1.070  Recent Lipid Panel    Component Value Date/Time   CHOL 165 03/03/2020 1119   TRIG 56 03/03/2020 1119   HDL 54 03/03/2020 1119  CHOLHDL 3.1 03/03/2020 1119   LDLCALC 100 (H) 03/03/2020 1119    Physical Exam:    VS:  BP 122/74    Pulse 75    Ht 5\' 6"  (1.676 m)    Wt 197 lb (89.4 kg)    SpO2 97%    BMI 31.80 kg/m     Wt Readings from Last 3 Encounters:  03/14/20 197 lb (89.4 kg)  03/03/20 187 lb 6.4 oz (85 kg)  02/04/20 184 lb (83.5 kg)     GEN:  Well nourished, well developed in no acute distress HEENT: Normal NECK: No JVD; No carotid bruits LYMPHATICS: No lymphadenopathy CARDIAC: RRR, no murmurs, rubs, gallops RESPIRATORY:  Clear to auscultation without rales, wheezing or rhonchi  ABDOMEN: Soft, non-tender, non-distended MUSCULOSKELETAL:  No edema; No deformity  SKIN: Warm and  dry NEUROLOGIC:  Alert and oriented x 3 PSYCHIATRIC:  Normal affect   ASSESSMENT:    1. Viral pericarditis, unspecified chronicity    PLAN:    In order of problems listed above:  1. Pericardial chest pain Now resolved after treatment with ibuprofen.  Echo shows no structural heart disease or significant effusion.  Plan to follow-up as needed.   Medication Adjustments/Labs and Tests Ordered: Current medicines are reviewed at length with the patient today.  Concerns regarding medicines are outlined above.  Orders Placed This Encounter  Procedures   EKG 12-Lead   No orders of the defined types were placed in this encounter.    Signed, 02/06/20, MD, [facility]  03/14/2020 8:41 AM    Electrophysiology [facility]

## 2020-03-14 NOTE — Patient Instructions (Addendum)
Medication Instructions:  Your physician recommends that you continue on your current medications as directed. Please refer to the Current Medication list given to you today.  *If you need a refill on your cardiac medications before your next appointment, please call your pharmacy*  Lab Work: None ordered.  If you have labs (blood work) drawn today and your tests are completely normal, you will receive your results only by: . MyChart Message (if you have MyChart) OR . A paper copy in the mail If you have any lab test that is abnormal or we need to change your treatment, we will call you to review the results.  Testing/Procedures: None ordered.  Follow-Up: At [facility], you and your health needs are our priority.  As part of our continuing mission to provide you with exceptional heart care, we have created designated Provider Care Teams.  These Care Teams include your primary Cardiologist (physician) and Advanced Practice Providers (APPs -  Physician Assistants and Nurse Practitioners) who all work together to provide you with the care you need, when you need it.  We recommend signing up for the patient portal called "MyChart".  Sign up information is provided on this After Visit Summary.  MyChart is used to connect with patients for Virtual Visits (Telemedicine).  Patients are able to view lab/test results, encounter notes, upcoming appointments, etc.  Non-urgent messages can be sent to your provider as well.   To learn more about what you can do with MyChart, go to [email or web address].    Your next appointment:   Your physician wants you to follow-up in: As needed.    Other Instructions:  

## 2020-04-10 ENCOUNTER — Emergency Department (HOSPITAL_COMMUNITY)
Admission: EM | Admit: 2020-04-10 | Discharge: 2020-04-10 | Disposition: A | Discharge status: Home / Self Care | Payer: Managed Care, Other (non HMO) | Attending: Emergency Medicine | Admitting: Emergency Medicine

## 2020-04-10 ENCOUNTER — Encounter (HOSPITAL_COMMUNITY): Payer: Self-pay

## 2020-04-10 ENCOUNTER — Other Ambulatory Visit: Payer: Self-pay

## 2020-04-10 DIAGNOSIS — Z8616 Personal history of COVID-19: Secondary | ICD-10-CM | POA: Diagnosis not present

## 2020-04-10 DIAGNOSIS — Z87891 Personal history of nicotine dependence: Secondary | ICD-10-CM | POA: Insufficient documentation

## 2020-04-10 DIAGNOSIS — M545 Low back pain, unspecified: Secondary | ICD-10-CM | POA: Diagnosis not present

## 2020-04-10 DIAGNOSIS — M549 Dorsalgia, unspecified: Secondary | ICD-10-CM | POA: Diagnosis present

## 2020-04-10 HISTORY — DX: COVID-19: U07.1

## 2020-04-10 MED ORDER — KETOROLAC TROMETHAMINE 60 MG/2ML IM SOLN
60.0000 mg | Freq: Once | INTRAMUSCULAR | Status: DC
  Filled 2020-04-10: qty 2

## 2020-04-10 MED ORDER — METHOCARBAMOL 500 MG PO TABS
500.0000 mg | ORAL_TABLET | Freq: Two times a day (BID) | ORAL | 0 refills | Status: DC
Start: 2020-04-10 — End: 2021-03-02
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
## 2020-04-10 NOTE — Discharge Instructions (Signed)
Take NSAIDs or Tylenol as needed for the next week. Take this medicine with food. Take muscle relaxer at bedtime to help you sleep. This medicine makes you drowsy so do not take before driving or work Use a heating pad for sore muscles - use for 20 minutes several times a day Try gentle range of motion exercises Return for worsening symptoms  

## 2020-05-01 ENCOUNTER — Encounter: Payer: Self-pay | Admitting: Registered Nurse

## 2020-05-01 NOTE — Progress Notes (Signed)
New Patient Office Visit  Subjective:  Patient ID: Robert Copeland, male    DOB: 02-25-1991  Age: 29 y.o. MRN: [id number]  CC:  Chief Complaint  Patient presents with   New Patient (Initial Visit)    Patient states he is here to Establish care and get an CPE.    HPI Robert Copeland presents for visit to est care and CPE  Histories reviewed with patient, updated as warranted  No acute concerns today. Last CPE some time ago Interested in routine blood work.  Past Medical History:  Diagnosis Date   Chest pain    COVID    Palpitations     No past surgical history on file.  Family History  Problem Relation Age of Onset   Healthy Mother    Hypertension Mother    Healthy Father     Social History   Socioeconomic History   Marital status: Married    Spouse name: Not on file   Number of children: Not on file   Years of education: Not on file   Highest education level: Not on file  Occupational History   Not on file  Tobacco Use   Smoking status: Former Smoker   Smokeless tobacco: Never Used  Building services engineer Use: Never used  Substance and Sexual Activity   Alcohol use: Not Currently   Drug use: Not Currently   Sexual activity: Not on file  Other Topics Concern   Not on file  Social History Narrative   Not on file   Social Determinants of Health   Financial Resource Strain: Not on file  Food Insecurity: Not on file  Transportation Needs: Not on file  Physical Activity: Not on file  Stress: Not on file  Social Connections: Not on file  Intimate Partner Violence: Not on file    ROS Review of Systems  Constitutional: Negative.   HENT: Negative.   Eyes: Negative.   Respiratory: Negative.   Cardiovascular: Negative.   Gastrointestinal: Negative.   Genitourinary: Negative.   Musculoskeletal: Negative.   Skin: Negative.   Neurological: Negative.   Psychiatric/Behavioral: Negative.     Objective:   Today's Vitals: BP  123/84    Pulse 83    Temp 98.5 F (36.9 C) (Temporal)    Resp 18    Ht 5\' 7"  (1.702 m)    Wt 187 lb 6.4 oz (85 kg)    SpO2 96%    BMI 29.35 kg/m   Physical Exam Vitals and nursing note reviewed.  Constitutional:      General: He is not in acute distress.    Appearance: Normal appearance. He is obese. He is not ill-appearing, toxic-appearing or diaphoretic.  HENT:     Head: Normocephalic and atraumatic.     Right Ear: Tympanic membrane, ear canal and external ear normal. There is no impacted cerumen.     Left Ear: Tympanic membrane, ear canal and external ear normal. There is no impacted cerumen.     Nose: Nose normal. No congestion or rhinorrhea.     Mouth/Throat:     Mouth: Mucous membranes are moist.     Pharynx: Oropharynx is clear. No oropharyngeal exudate or posterior oropharyngeal erythema.  Eyes:     General: No scleral icterus.       Right eye: No discharge.        Left eye: No discharge.     Extraocular Movements: Extraocular movements intact.     Conjunctiva/sclera: Conjunctivae normal.  Pupils: Pupils are equal, round, and reactive to light.  Neck:     Vascular: No carotid bruit.  Cardiovascular:     Rate and Rhythm: Normal rate and regular rhythm.     Pulses: Normal pulses.     Heart sounds: Normal heart sounds. No murmur heard. No friction rub. No gallop.   Pulmonary:     Effort: Pulmonary effort is normal. No respiratory distress.     Breath sounds: Normal breath sounds. No stridor. No wheezing, rhonchi or rales.  Chest:     Chest wall: No tenderness.  Abdominal:     General: Abdomen is flat. Bowel sounds are normal. There is no distension.     Palpations: Abdomen is soft. There is no mass.     Tenderness: There is no abdominal tenderness. There is no right CVA tenderness, left CVA tenderness, guarding or rebound.     Hernia: No hernia is present.  Musculoskeletal:        General: No swelling, tenderness, deformity or signs of injury. Normal range of  motion.     Cervical back: Normal range of motion and neck supple. No rigidity or tenderness.     Right lower leg: No edema.     Left lower leg: No edema.  Lymphadenopathy:     Cervical: No cervical adenopathy.  Skin:    General: Skin is warm and dry.     Capillary Refill: Capillary refill takes less than 2 seconds.     Coloration: Skin is not jaundiced or pale.     Findings: No bruising, erythema, lesion or rash.  Neurological:     General: No focal deficit present.     Mental Status: He is alert and oriented to person, place, and time. Mental status is at baseline.     Cranial Nerves: No cranial nerve deficit.     Sensory: No sensory deficit.     Motor: No weakness.     Coordination: Coordination normal.     Gait: Gait normal.     Deep Tendon Reflexes: Reflexes normal.  Psychiatric:        Mood and Affect: Mood normal.        Behavior: Behavior normal.        Thought Content: Thought content normal.        Judgment: Judgment normal.     Assessment & Plan:   Problem List Items Addressed This Visit   None   Visit Diagnoses    Annual physical exam    -  Primary   Encounter to establish care       Screening for endocrine, metabolic and immunity disorder       Relevant Orders   CBC With Differential (Completed)   Comprehensive metabolic panel (Completed)   TSH (Completed)   POCT glycosylated hemoglobin (Hb A1C) (Completed)   Lipid screening       Relevant Orders   Lipid panel (Completed)      Outpatient Encounter Medications as of 03/03/2020  Medication Sig   [DISCONTINUED] cyclobenzaprine (FLEXERIL) 10 MG tablet Take 1 tablet (10 mg total) by mouth 2 (two) times daily as needed for muscle spasms.   [DISCONTINUED] famotidine (PEPCID) 20 MG tablet Take 1 tablet (20 mg total) by mouth 2 (two) times daily.   [DISCONTINUED] fluticasone (FLONASE) 50 MCG/ACT nasal spray Place 1 spray into both nostrils daily.   No facility-administered encounter medications on file as  of 03/03/2020.    Follow-up: No follow-ups on file.   PLAN  Exam unremarkable  Labs collected. Will follow up with the patient as warranted.  Return in 1 year for cpe w labs  Patient encouraged to call clinic with any questions, comments, or concerns.  Janeece Agee, NP

## 2020-05-05 ENCOUNTER — Telehealth: Payer: Self-pay | Admitting: Cardiology

## 2020-05-05 NOTE — Telephone Encounter (Signed)
Patient said he has had consistent pericarditis pain since his visit with Dr. Lalla Brothers 03/14/20. He has just been taking ibuprofen for the pain but was wondering if there was anything stronger that Dr. Lalla Brothers could put him on.   Patient also states his daughter had a rapid COVID test done today that came back positive. He wanted to know if there was anything Dr. Lalla Brothers could recommend to avoid him having another pericarditis flare up. Please advise

## 2021-02-26 ENCOUNTER — Encounter: Payer: Self-pay | Admitting: Registered Nurse

## 2021-03-01 ENCOUNTER — Ambulatory Visit (INDEPENDENT_AMBULATORY_CARE_PROVIDER_SITE_OTHER): Payer: 59 | Admitting: Registered Nurse

## 2021-03-01 ENCOUNTER — Encounter: Payer: Self-pay | Admitting: Registered Nurse

## 2021-03-01 ENCOUNTER — Other Ambulatory Visit: Payer: Self-pay

## 2021-03-01 VITALS — BP 138/88 | HR 91 | Temp 98.1°F | Resp 20 | Ht 68.0 in | Wt 243.4 lb

## 2021-03-01 DIAGNOSIS — Z0000 Encounter for general adult medical examination without abnormal findings: Secondary | ICD-10-CM

## 2021-03-01 DIAGNOSIS — R5382 Chronic fatigue, unspecified: Secondary | ICD-10-CM | POA: Diagnosis not present

## 2021-03-01 DIAGNOSIS — Z1329 Encounter for screening for other suspected endocrine disorder: Secondary | ICD-10-CM | POA: Diagnosis not present

## 2021-03-01 DIAGNOSIS — Z130 Encounter for screening for diseases of the blood and blood-forming organs and certain disorders involving the immune mechanism: Secondary | ICD-10-CM | POA: Diagnosis not present

## 2021-03-01 DIAGNOSIS — R0681 Apnea, not elsewhere classified: Secondary | ICD-10-CM

## 2021-03-01 DIAGNOSIS — Z13228 Encounter for screening for other metabolic disorders: Secondary | ICD-10-CM

## 2021-03-01 DIAGNOSIS — Z13220 Encounter for screening for lipoid disorders: Secondary | ICD-10-CM | POA: Diagnosis not present

## 2021-03-01 NOTE — Progress Notes (Signed)
Established Patient Office Visit  Subjective:  Patient ID: Robert Copeland, male    DOB: 24-Mar-1991  Age: 30 y.o. MRN: [id number]  CC:  Chief Complaint  Patient presents with   Annual Exam    HPI Robert Copeland presents for CPE  Notes weight gain and fatigue Ongoing for around 1 year Notes snoring when sleeping, partner says he gasps, coughs, chokes during sleep as well.  Long family hx of OSA.  He gets 5-6 hours of sleep a night. Mostly sleeping prone as supine causes him to wake frequently. A lot of daytime somnolence. Feels like he could nap most days at 2-4pm.  Otherwise no symptoms suggesting thyroid dysfunction or anemia to explain fatigue.   Past Medical History:  Diagnosis Date   Chest pain    COVID    Palpitations     History reviewed. No pertinent surgical history.  Family History  Problem Relation Age of Onset   Healthy Mother    Hypertension Mother    Healthy Father     Social History   Socioeconomic History   Marital status: Married    Spouse name: Not on file   Number of children: Not on file   Years of education: Not on file   Highest education level: Not on file  Occupational History   Not on file  Tobacco Use   Smoking status: Former   Smokeless tobacco: Never  Vaping Use   Vaping Use: Never used  Substance and Sexual Activity   Alcohol use: Not Currently   Drug use: Not Currently   Sexual activity: Not on file  Other Topics Concern   Not on file  Social History Narrative   Not on file   Social Determinants of Health   Financial Resource Strain: Not on file  Food Insecurity: Not on file  Transportation Needs: Not on file  Physical Activity: Not on file  Stress: Not on file  Social Connections: Not on file  Intimate Partner Violence: Not on file    Outpatient Medications Prior to Visit  Medication Sig Dispense Refill   methocarbamol (ROBAXIN) 500 MG tablet Take 1 tablet (500 mg total) by mouth 2 (two) times daily.  (Patient not taking: Reported on 03/01/2021) 20 tablet 0   pantoprazole (PROTONIX) 40 MG tablet Take 1 tablet (40 mg total) by mouth daily. (Patient not taking: Reported on 03/01/2021) 90 tablet 3   No facility-administered medications prior to visit.    No Known Allergies  ROS Review of Systems  Constitutional:  Positive for fatigue and unexpected weight change. Negative for activity change, appetite change, chills, diaphoresis and fever.  HENT: Negative.    Eyes: Negative.   Respiratory: Negative.    Cardiovascular: Negative.   Gastrointestinal: Negative.   Genitourinary: Negative.   Musculoskeletal: Negative.   Skin: Negative.   Neurological: Negative.   Psychiatric/Behavioral: Negative.       Objective:    Physical Exam Vitals and nursing note reviewed.  Constitutional:      General: He is not in acute distress.    Appearance: Normal appearance. He is normal weight. He is not ill-appearing, toxic-appearing or diaphoretic.  HENT:     Head: Normocephalic and atraumatic.     Right Ear: Tympanic membrane, ear canal and external ear normal. There is no impacted cerumen.     Left Ear: Tympanic membrane, ear canal and external ear normal. There is no impacted cerumen.     Nose: Nose normal. No congestion or rhinorrhea.  Mouth/Throat:     Mouth: Mucous membranes are moist.     Pharynx: Oropharynx is clear. No oropharyngeal exudate or posterior oropharyngeal erythema.  Eyes:     General: No scleral icterus.       Right eye: No discharge.        Left eye: No discharge.     Extraocular Movements: Extraocular movements intact.     Conjunctiva/sclera: Conjunctivae normal.     Pupils: Pupils are equal, round, and reactive to light.  Neck:     Vascular: No carotid bruit.  Cardiovascular:     Rate and Rhythm: Normal rate and regular rhythm.     Pulses: Normal pulses.     Heart sounds: Normal heart sounds. No murmur heard.   No friction rub. No gallop.  Pulmonary:      Effort: Pulmonary effort is normal. No respiratory distress.     Breath sounds: Normal breath sounds. No stridor. No wheezing, rhonchi or rales.  Chest:     Chest wall: No tenderness.  Abdominal:     General: Abdomen is flat. Bowel sounds are normal. There is no distension.     Palpations: Abdomen is soft. There is no mass.     Tenderness: There is no abdominal tenderness. There is no right CVA tenderness, left CVA tenderness, guarding or rebound.     Hernia: No hernia is present.  Musculoskeletal:        General: No swelling, tenderness, deformity or signs of injury. Normal range of motion.     Cervical back: Normal range of motion and neck supple. No rigidity or tenderness.     Right lower leg: No edema.     Left lower leg: No edema.  Lymphadenopathy:     Cervical: No cervical adenopathy.  Skin:    General: Skin is warm and dry.     Capillary Refill: Capillary refill takes less than 2 seconds.     Coloration: Skin is not jaundiced or pale.     Findings: No bruising, erythema, lesion or rash.  Neurological:     General: No focal deficit present.     Mental Status: He is alert and oriented to person, place, and time. Mental status is at baseline.     Cranial Nerves: No cranial nerve deficit.     Motor: No weakness.     Gait: Gait normal.  Psychiatric:        Mood and Affect: Mood normal.        Behavior: Behavior normal.        Thought Content: Thought content normal.        Judgment: Judgment normal.    BP 138/88   Pulse 91   Temp 98.1 F (36.7 C) (Temporal)   Resp 20   Ht 5\' 8"  (1.727 m)   Wt 243 lb 6.4 oz (110.4 kg)   SpO2 97%   BMI 37.01 kg/m  Wt Readings from Last 3 Encounters:  03/01/21 243 lb 6.4 oz (110.4 kg)  04/10/20 197 lb (89.4 kg)  03/14/20 197 lb (89.4 kg)     There are no preventive care reminders to display for this patient.  There are no preventive care reminders to display for this patient.  Lab Results  Component Value Date   TSH 1.070  03/03/2020   Lab Results  Component Value Date   WBC 5.9 03/03/2020   HGB 15.5 03/03/2020   HCT 46.9 03/03/2020   MCV 84 03/03/2020   PLT 233 01/23/2020  Lab Results  Component Value Date   NA 135 03/03/2020   K 4.7 03/03/2020   CO2 22 03/03/2020   GLUCOSE 97 03/03/2020   BUN 8 03/03/2020   CREATININE 1.02 03/03/2020   BILITOT 0.3 03/03/2020   ALKPHOS 53 03/03/2020   AST 23 03/03/2020   ALT 40 03/03/2020   PROT 7.2 03/03/2020   ALBUMIN 4.6 03/03/2020   CALCIUM 9.2 03/03/2020   ANIONGAP 12 01/23/2020   Lab Results  Component Value Date   CHOL 165 03/03/2020   Lab Results  Component Value Date   HDL 54 03/03/2020   Lab Results  Component Value Date   LDLCALC 100 (H) 03/03/2020   Lab Results  Component Value Date   TRIG 56 03/03/2020   Lab Results  Component Value Date   CHOLHDL 3.1 03/03/2020   Lab Results  Component Value Date   HGBA1C 5.8 (A) 03/03/2020      Assessment & Plan:   Problem List Items Addressed This Visit       Other   Witnessed episode of apnea   Relevant Orders   Ambulatory referral to Neurology   Other Visit Diagnoses     Annual physical exam    -  Primary   Chronic fatigue       Relevant Orders   Vitamin D (25 hydroxy)   Screening for endocrine, metabolic and immunity disorder       Relevant Orders   CBC with Differential/Platelet   TSH   Comprehensive metabolic panel   Hemoglobin A1c   Lipid screening       Relevant Orders   Lipid panel       No orders of the defined types were placed in this encounter.   Follow-up: Return in about 1 year (around 03/01/2022) for CPE and labs.   PLAN Unremarkable exam Suspect OSA. Will refer. Labs collected. Will follow up with the patient as warranted. Patient encouraged to call clinic with any questions, comments, or concerns.  Janeece Agee, NP

## 2021-03-01 NOTE — Patient Instructions (Addendum)
[name] to see you  Exam reassuring.  I will refer you to Dr. Porfirio Mylar Dohmeier to assess for sleep apnea. I feel it's likely, but she will see to what extent it's occurring and what we can do about it.  Diet - eat real food, not too much, mostly plants. In other words, limit processed foods, control portion sizes, and mostly plants. Check out some books by Cristal Ford if you want a good historical look at Americans' relationship with diet and diet recommendations.  Exercise should be 30 minutes daily at least 5 days each week. This should be in addition to any routine day to day activity. Walking, jogging, yoga, and body weight exercises are great for this.  For healthy, quick, meals check out these recipes:  [employer]  https://www.cookingclassy.com/black-bean-tacos-with-avocado-cilantro-lime-crema/  https://www.jaroflemons.com/chickpea-fall-salad/  https://www.jaroflemons.com/vegan-meal-prep-black-bean-quinoa-bowls/   Check out Yoga With Adriene on YouTube if you're looking to get into yoga.  I'll let you know how labs look,  [name]

## 2021-03-02 ENCOUNTER — Other Ambulatory Visit: Payer: Self-pay | Admitting: Registered Nurse

## 2021-03-02 DIAGNOSIS — E559 Vitamin D deficiency, unspecified: Secondary | ICD-10-CM

## 2021-03-02 LAB — LIPID PANEL
Cholesterol: 170 mg/dL (ref 0–200)
HDL: 44.6 mg/dL (ref >39.00)
LDL Cholesterol: 95 mg/dL (ref 0–99)
NonHDL: 125.89
Total CHOL/HDL Ratio: 4
Triglycerides: 153 mg/dL — ABNORMAL HIGH (ref 0.0–149.0)
VLDL: 30.6 mg/dL (ref 0.0–40.0)

## 2021-03-02 LAB — CBC WITH DIFFERENTIAL/PLATELET
Basophils Absolute: 0 10*3/uL (ref 0.0–0.1)
Basophils Relative: 0.7 % (ref 0.0–3.0)
Eosinophils Absolute: 0.2 10*3/uL (ref 0.0–0.7)
Eosinophils Relative: 4.2 % (ref 0.0–5.0)
HCT: 46.2 % (ref 39.0–52.0)
Hemoglobin: 14.9 g/dL (ref 13.0–17.0)
Lymphocytes Relative: 39.7 % (ref 12.0–46.0)
Lymphs Abs: 2.2 10*3/uL (ref 0.7–4.0)
MCHC: 32.2 g/dL (ref 30.0–36.0)
MCV: 84 fl (ref 78.0–100.0)
Monocytes Absolute: 0.5 10*3/uL (ref 0.1–1.0)
Monocytes Relative: 9.3 % (ref 3.0–12.0)
Neutro Abs: 2.6 10*3/uL (ref 1.4–7.7)
Neutrophils Relative %: 46.1 % (ref 43.0–77.0)
Platelets: 232 10*3/uL (ref 150.0–400.0)
RBC: 5.5 Mil/uL (ref 4.22–5.81)
RDW: 13.5 % (ref 11.5–15.5)
WBC: 5.6 10*3/uL (ref 4.0–10.5)

## 2021-03-02 LAB — COMPREHENSIVE METABOLIC PANEL
ALT: 52 U/L (ref 0–53)
AST: 28 U/L (ref 0–37)
Albumin: 4.4 g/dL (ref 3.5–5.2)
Alkaline Phosphatase: 40 U/L (ref 39–117)
BUN: 11 mg/dL (ref 6–23)
CO2: 26 mEq/L (ref 19–32)
Calcium: 9.2 mg/dL (ref 8.4–10.5)
Chloride: 102 mEq/L (ref 96–112)
Creatinine, Ser: 1.31 mg/dL (ref 0.40–1.50)
GFR: 73.32 mL/min (ref >60.00)
Glucose, Bld: 108 mg/dL — ABNORMAL HIGH (ref 70–99)
Potassium: 4.1 mEq/L (ref 3.5–5.1)
Sodium: 137 mEq/L (ref 135–145)
Total Bilirubin: 0.6 mg/dL (ref 0.2–1.2)
Total Protein: 7.1 g/dL (ref 6.0–8.3)

## 2021-03-02 LAB — HEMOGLOBIN A1C: Hgb A1c MFr Bld: 6.2 % (ref 4.6–6.5)

## 2021-03-02 LAB — TSH: TSH: 0.5 u[IU]/mL (ref 0.35–5.50)

## 2021-03-02 LAB — VITAMIN D 25 HYDROXY (VIT D DEFICIENCY, FRACTURES): VITD: 14.39 ng/mL — ABNORMAL LOW (ref 30.00–100.00)

## 2021-03-02 MED ORDER — VITAMIN D (ERGOCALCIFEROL) 1.25 MG (50000 UNIT) PO CAPS: 50000.0000 [IU] | ORAL_CAPSULE | ORAL | 0 refills | Status: DC

## 2021-03-04 ENCOUNTER — Encounter: Payer: Self-pay | Admitting: Registered Nurse

## 2021-03-19 DIAGNOSIS — S60112A Contusion of left thumb with damage to nail, initial encounter: Secondary | ICD-10-CM | POA: Diagnosis not present

## 2021-03-19 DIAGNOSIS — M20011 Mallet finger of right finger(s): Secondary | ICD-10-CM | POA: Diagnosis not present

## 2021-03-19 DIAGNOSIS — S63614A Unspecified sprain of right ring finger, initial encounter: Secondary | ICD-10-CM | POA: Diagnosis not present

## 2021-03-19 DIAGNOSIS — S90212A Contusion of left great toe with damage to nail, initial encounter: Secondary | ICD-10-CM | POA: Diagnosis not present

## 2021-05-26 IMAGING — DX DG CHEST 2V
2 series · 2 of 2 positions shown · non-contrast
Comparison: None.

CLINICAL DATA: Chest pain

EXAM:
CHEST - 2 VIEW

[chest pa]
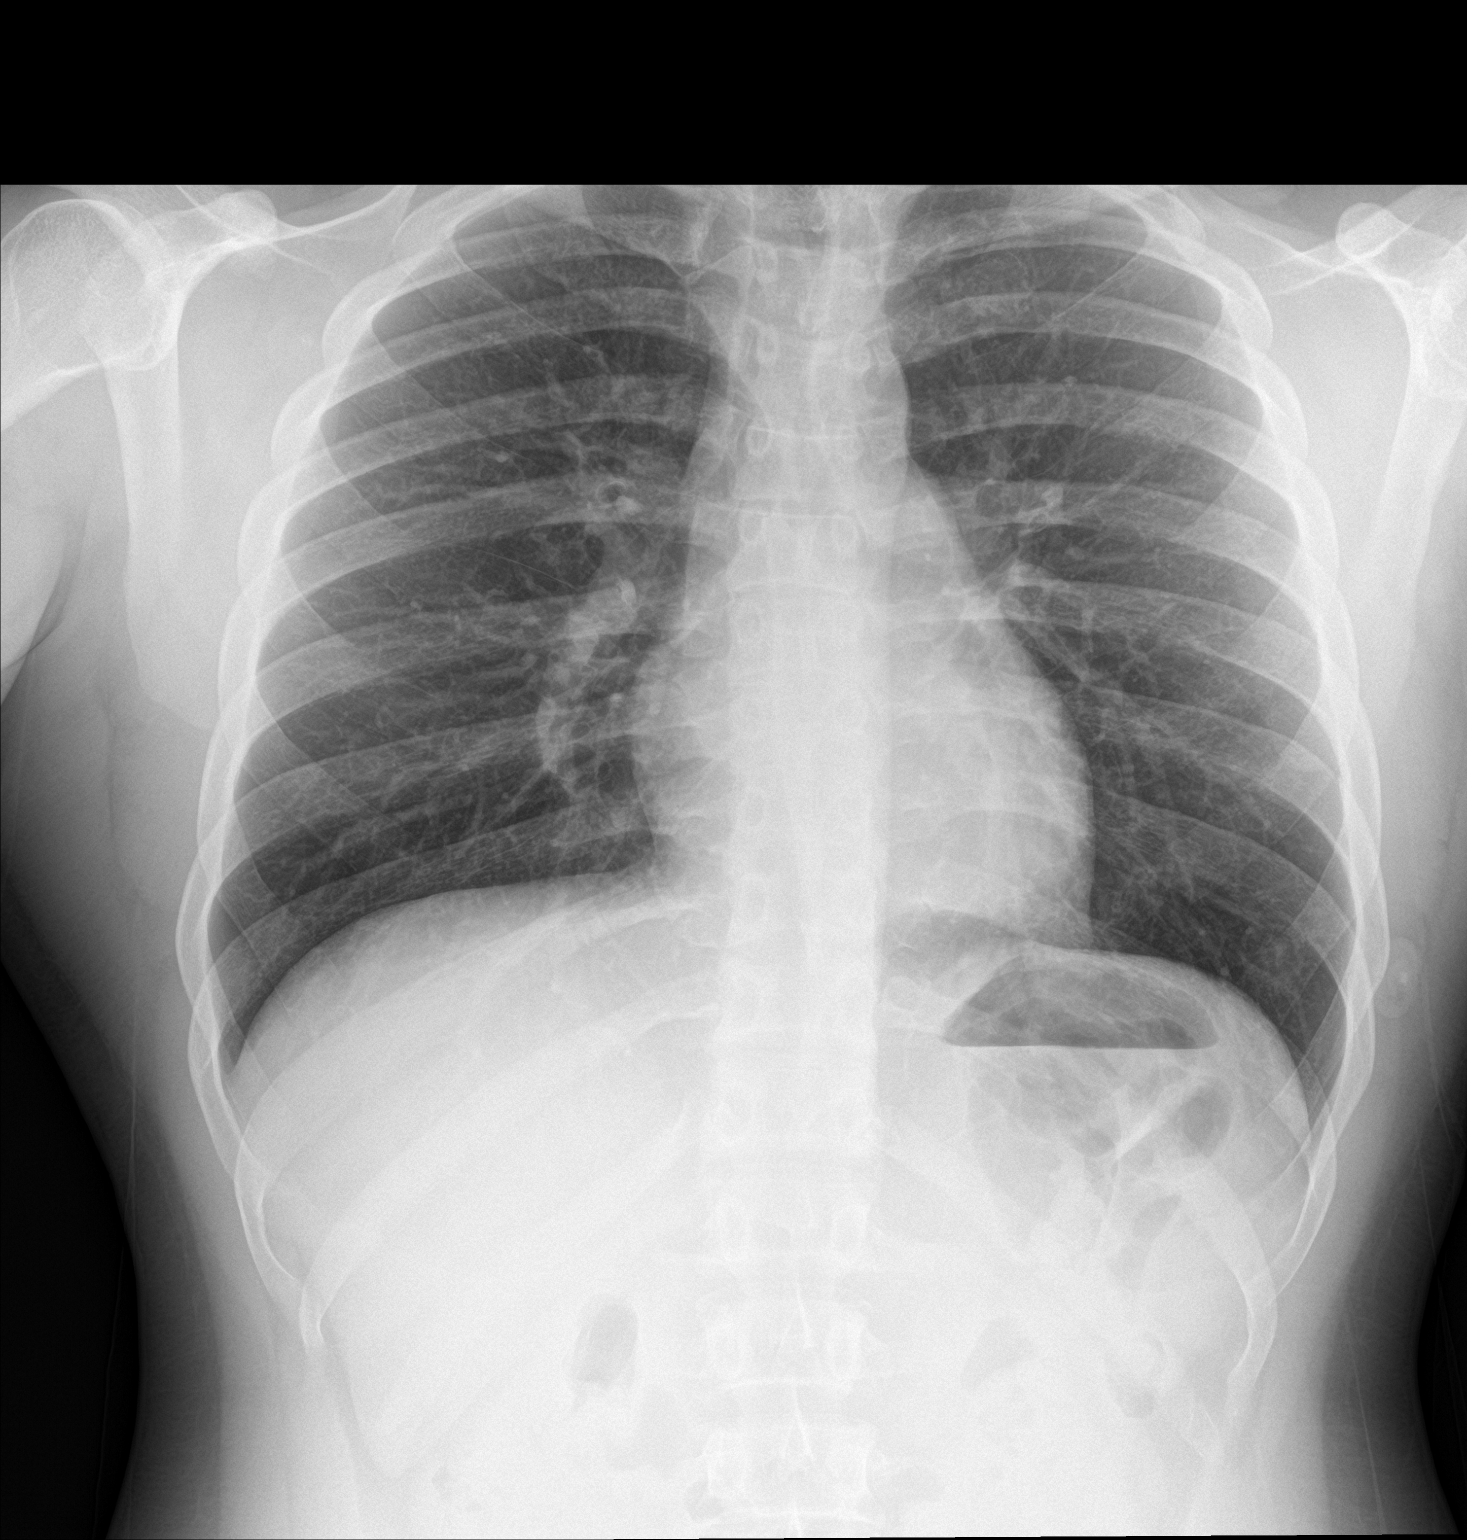

[chest lat]
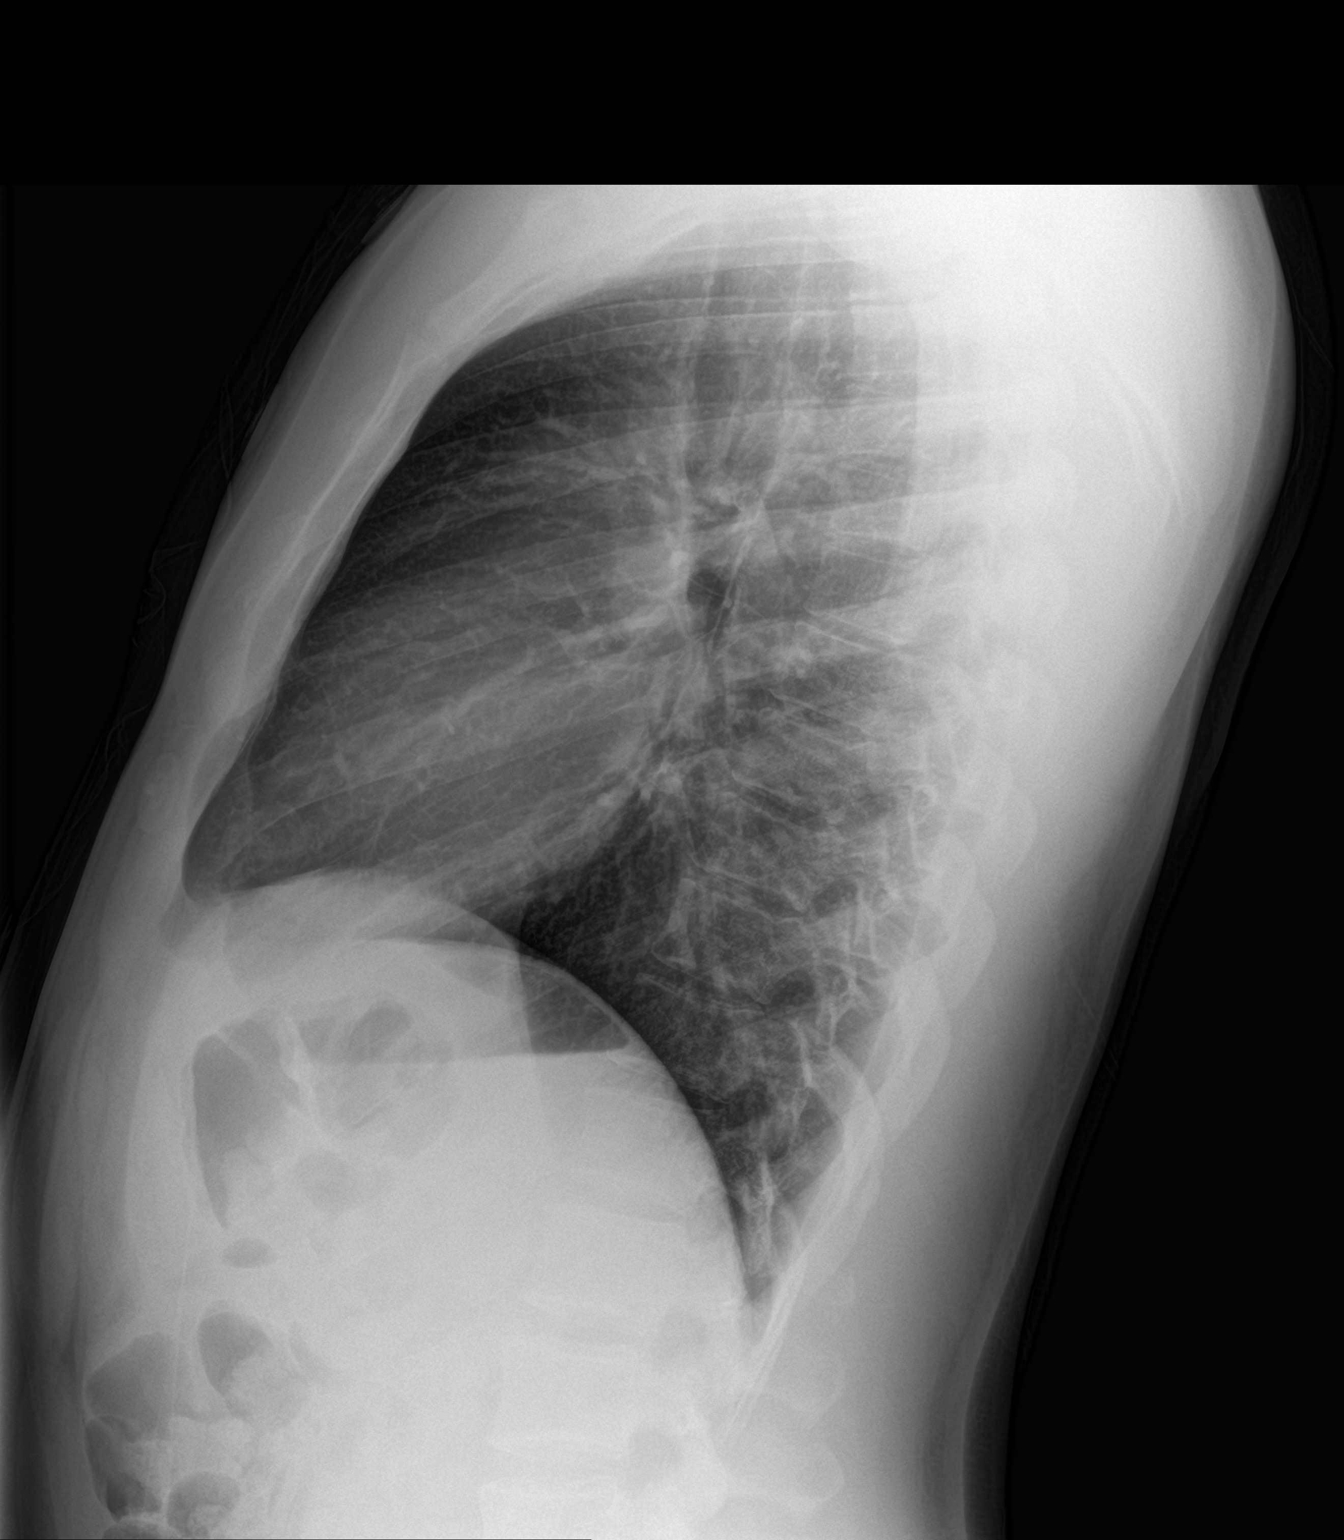

[2 of 2 positions shown; findings below may reference images not displayed]

FINDINGS: The heart size and mediastinal contours are within normal limits.
Both lungs are clear. The visualized skeletal structures are
unremarkable.
IMPRESSION: Normal study.

## 2021-06-05 ENCOUNTER — Institutional Professional Consult (permissible substitution): Payer: 59 | Admitting: Neurology

## 2021-06-05 ENCOUNTER — Encounter: Payer: Self-pay | Admitting: Neurology

## 2022-03-04 ENCOUNTER — Encounter: Payer: 59 | Admitting: Registered Nurse

## 2022-09-24 ENCOUNTER — Emergency Department (HOSPITAL_COMMUNITY): Payer: BC Managed Care – PPO

## 2022-09-24 ENCOUNTER — Other Ambulatory Visit: Payer: Self-pay

## 2022-09-24 ENCOUNTER — Emergency Department (HOSPITAL_COMMUNITY)
Admission: EM | Admit: 2022-09-24 | Discharge: 2022-09-24 | Disposition: A | Discharge status: Home / Self Care | Payer: BC Managed Care – PPO | Attending: Emergency Medicine | Admitting: Emergency Medicine

## 2022-09-24 ENCOUNTER — Encounter (HOSPITAL_COMMUNITY): Payer: Self-pay

## 2022-09-24 DIAGNOSIS — R079 Chest pain, unspecified: Secondary | ICD-10-CM

## 2022-09-24 DIAGNOSIS — Z1152 Encounter for screening for COVID-19: Secondary | ICD-10-CM | POA: Insufficient documentation

## 2022-09-24 DIAGNOSIS — R519 Headache, unspecified: Secondary | ICD-10-CM

## 2022-09-24 DIAGNOSIS — Z8616 Personal history of COVID-19: Secondary | ICD-10-CM | POA: Diagnosis not present

## 2022-09-24 DIAGNOSIS — E860 Dehydration: Secondary | ICD-10-CM | POA: Diagnosis not present

## 2022-09-24 DIAGNOSIS — R0789 Other chest pain: Secondary | ICD-10-CM | POA: Diagnosis not present

## 2022-09-24 LAB — CBC
HCT: 47.2 % (ref 39.0–52.0)
Hemoglobin: 15.3 g/dL (ref 13.0–17.0)
MCH: 27.4 pg (ref 26.0–34.0)
MCHC: 32.4 g/dL (ref 30.0–36.0)
MCV: 84.4 fL (ref 80.0–100.0)
Platelets: 248 10*3/uL (ref 150–400)
RBC: 5.59 MIL/uL (ref 4.22–5.81)
RDW: 13.2 % (ref 11.5–15.5)
WBC: 10.9 10*3/uL — ABNORMAL HIGH (ref 4.0–10.5)
nRBC: 0 % (ref 0.0–0.2)

## 2022-09-24 LAB — BASIC METABOLIC PANEL
Anion gap: 10 (ref 5–15)
BUN: 7 mg/dL (ref 6–20)
CO2: 25 mmol/L (ref 22–32)
Calcium: 9.1 mg/dL (ref 8.9–10.3)
Chloride: 102 mmol/L (ref 98–111)
Creatinine, Ser: 1.26 mg/dL — ABNORMAL HIGH (ref 0.61–1.24)
GFR, Estimated: >60 mL/min (ref >60)
Glucose, Bld: 112 mg/dL — ABNORMAL HIGH (ref 70–99)
Potassium: 4.3 mmol/L (ref 3.5–5.1)
Sodium: 137 mmol/L (ref 135–145)

## 2022-09-24 LAB — TROPONIN I (HIGH SENSITIVITY): Troponin I (High Sensitivity): 4 ng/L (ref <18)

## 2022-09-24 LAB — SARS CORONAVIRUS 2 BY RT PCR: SARS Coronavirus 2 by RT PCR: NEGATIVE

## 2022-09-24 LAB — D-DIMER, QUANTITATIVE: D-Dimer, Quant: 0.27 ug/mL-FEU (ref 0.00–0.50)

## 2022-09-24 MED ORDER — DIPHENHYDRAMINE HCL 50 MG/ML IJ SOLN
25.0000 mg | Freq: Once | INTRAMUSCULAR | Status: DC
  Filled 2022-09-24: qty 1

## 2022-09-24 MED ORDER — ACETAMINOPHEN 325 MG PO TABS
650.0000 mg | ORAL_TABLET | Freq: Once | ORAL | Status: AC
  Administered 2022-09-24: 650 mg via ORAL
  Filled 2022-09-24: qty 2

## 2022-09-24 MED ORDER — PROCHLORPERAZINE EDISYLATE 10 MG/2ML IJ SOLN
10.0000 mg | Freq: Once | INTRAMUSCULAR | Status: DC
  Filled 2022-09-24: qty 2

## 2022-09-24 MED ORDER — KETOROLAC TROMETHAMINE 15 MG/ML IJ SOLN
15.0000 mg | Freq: Once | INTRAMUSCULAR | Status: DC
  Filled 2022-09-24: qty 1

## 2022-09-24 MED ORDER — SODIUM CHLORIDE 0.9 % IV BOLUS
1000.0000 mL | Freq: Once | INTRAVENOUS | Status: AC
  Administered 2022-09-24: 1000 mL via INTRAVENOUS

## 2022-09-24 NOTE — ED Notes (Signed)
Patient transported to X-ray 

## 2022-09-24 NOTE — Discharge Instructions (Signed)
Please use Tylenol or ibuprofen for pain.  You may use 600 mg ibuprofen every 6 hours or 1000 mg of Tylenol every 6 hours.  You may choose to alternate between the 2.  This would be most effective.  Not to exceed 4 g of Tylenol within 24 hours.  Not to exceed 3200 mg ibuprofen 24 hours.  

## 2022-09-24 NOTE — ED Triage Notes (Signed)
Pt has been waking up with dry mouth and chest pain. States the chest pain feels the same as when he had pericarditis in 2022. Chest pain is central and does not radiate.chest pain is sharp and dull. Denies SOB. Chest pain comes and goes and is not constant

## 2022-09-24 NOTE — ED Provider Notes (Signed)
[place] [facility]ERGENCY DEPARTMENT AT [facility] Provider Note   CSN: [id number] Arrival date & time: 09/24/22  1933     History  Chief Complaint  Patient presents with   Headache   Chest Pain    Robert Copeland is a 32 y.o. male past medical history significant for COVID with previous pericarditis who presents with concern for dry mouth, chest pain only for several days.  Patient reports that he has also had a headache intermittently for the last several days.  He denies any shortness of breath.  He denies any history of hypertension, hyperlipidemia.  He denies any fever or chills, cough, nausea, vomiting.  He denies any recent sick contacts.  Patient denies taking anything for the pain prior to arrival.  Patient reports that he has had poor oral intake recently, but is trying to increase his hydration over the last couple of days.   Headache Chest Pain Associated symptoms: headache        Home Medications Prior to Admission medications   Medication Sig Start Date End Date Taking? Authorizing Provider  Vitamin D, Ergocalciferol, (DRISDOL) 1.25 MG (50000 UNIT) CAPS capsule Take 1 capsule (50,000 Units total) by mouth every 7 (seven) days. 03/02/21   Janeece Agee, NP      Allergies    Patient has no known allergies.    Review of Systems   Review of Systems  Cardiovascular:  Positive for chest pain.  Neurological:  Positive for headaches.  All other systems reviewed and are negative.   Physical Exam Updated Vital Signs BP (!) 143/95   Pulse 96   Temp 98.5 F (36.9 C) (Oral)   Resp 19   Ht 5\' 6"  (1.676 m)   Wt 108.9 kg   SpO2 98%   BMI 38.74 kg/m  Physical Exam Vitals and nursing note reviewed.  Constitutional:      General: He is not in acute distress.    Appearance: Normal appearance.  HENT:     Head: Normocephalic and atraumatic.     Comments: Dry mucous membranes Eyes:     General:        Right eye: No discharge.        Left eye: No  discharge.  Cardiovascular:     Rate and Rhythm: Regular rhythm. Tachycardia present.     Heart sounds: No murmur heard.    No friction rub. No gallop.     Comments: Tachycardia improved on reevaluation Pulmonary:     Effort: Pulmonary effort is normal.     Breath sounds: Normal breath sounds.  Abdominal:     General: Bowel sounds are normal.     Palpations: Abdomen is soft.  Skin:    General: Skin is warm and dry.     Capillary Refill: Capillary refill takes less than 2 seconds.  Neurological:     Mental Status: He is alert and oriented to person, place, and time.  Psychiatric:        Mood and Affect: Mood normal.        Behavior: Behavior normal.     ED Results / Procedures / Treatments   Labs (all labs ordered are listed, but only abnormal results are displayed) Labs Reviewed  BASIC METABOLIC PANEL - Abnormal; Notable for the following components:      Result Value   Glucose, Bld 112 (*)    Creatinine, Ser 1.26 (*)    All other components within normal limits  CBC - Abnormal; Notable for  the following components:   WBC 10.9 (*)    All other components within normal limits  SARS CORONAVIRUS 2 BY RT PCR  D-DIMER, QUANTITATIVE  TROPONIN I (HIGH SENSITIVITY)    EKG None  Radiology DG Chest 2 View  Result Date: 09/24/2022 CLINICAL DATA:  Chest pain. EXAM: CHEST - 2 VIEW COMPARISON:  January 23, 2020. FINDINGS: The heart size and mediastinal contours are within normal limits. Both lungs are clear. The visualized skeletal structures are unremarkable. IMPRESSION: No active cardiopulmonary disease. Electronically Signed   By: Lupita Raider M.D.   On: 09/24/2022 20:45    Procedures Procedures    Medications Ordered in ED Medications  ketorolac (TORADOL) 15 MG/ML injection 15 mg (0 mg Intravenous Hold 09/24/22 2025)  prochlorperazine (COMPAZINE) injection 10 mg (0 mg Intravenous Hold 09/24/22 2025)  diphenhydrAMINE (BENADRYL) injection 25 mg (0 mg Intravenous Hold 09/24/22  2026)  sodium chloride 0.9 % bolus 1,000 mL (0 mLs Intravenous Stopped 09/24/22 2138)  acetaminophen (TYLENOL) tablet 650 mg (650 mg Oral Given 09/24/22 2023)    ED Course/ Medical Decision Making/ A&P                             Medical Decision Making Amount and/or Complexity of Data Reviewed Labs: ordered. Radiology: ordered.  Risk OTC drugs. Prescription drug management.   This patient is a 32 y.o. male who presents to the ED for concern of chest pain, headache, this involves an extensive number of treatment options, and is a complaint that carries with it a high risk of complications and morbidity. The emergent differential diagnosis prior to evaluation includes, but is not limited to,  ACS, AAS, PE, Mallory-Weiss, Boerhaave's, Pneumonia, acute bronchitis, asthma or COPD exacerbation, anxiety, MSK pain or traumatic injury to the chest, acid reflux versus , Stroke, increased ICP, meningitis, CVA, intracranial tumor, venous sinus thrombosis, migraine, cluster headache, hypertension, drug related, head injury, tension headache, sinusitis, dental abscess, otitis media, TMJ . This is not an exhaustive differential.   Past Medical History / Co-morbidities / Social History: Previous covid, pericarditis  Physical Exam: Physical exam performed. The pertinent findings include: Patient mildly tachycardic on arrival, he has no significant tenderness to palpation of the chest wall.  He is otherwise in no acute distress, stable oxygen saturation on room air, no accessory breath sounds.  He does appear mildly dry on exam.  Lab Tests: I ordered, and personally interpreted labs.  The pertinent results include: Negative D-dimer, no evidence of PE, negative troponin x 1 in context of intermittent chest pain for days, no active chest pain at time my evaluation.  Patient is a very mild leukocytosis, white blood cells 10.9, unclear etiology, low suspicion for acute infectious process.  His BMP is notable for  mildly elevated creatinine 1.26 which agrees with clinical assessment of dehydration.  His COVID test is negative.   Imaging Studies: I ordered imaging studies including plain film chest x-ray. I independently visualized and interpreted imaging which showed no acute intrathoracic abnormality. I agree with the radiologist interpretation.   Cardiac Monitoring:  The patient was maintained on a cardiac monitor.  My attending physician Dr. Theresia Lo viewed and interpreted the cardiac monitored which showed an underlying rhythm of: Sinus tachycardia, no evidence of diffuse ST elevation suggestive of pericarditis. I agree with this interpretation.   Medications: I ordered medication including fluid bolus, Tylenol for headache, dehydration. Reevaluation of the patient after these medicines showed  that the patient improved. I have reviewed the patients home medicines and have made adjustments as needed.    Disposition: After consideration of the diagnostic results and the patients response to treatment, I feel that patient with chest pain of unclear etiology, likely musculoskeletal or acid reflux related, he has a headache which is likely related to his dehydration, he is feeling better after fluid repletion.  Encouraged continued fluid intake at home..   emergency department workup does not suggest an emergent condition requiring admission or immediate intervention beyond what has been performed at this time. The plan is: as above. The patient is safe for discharge and has been instructed to return immediately for worsening symptoms, change in symptoms or any other concerns.  I discussed this case with my attending physician Dr. Theresia Lo who cosigned this note including patient's presenting symptoms, physical exam, and planned diagnostics and interventions. Attending physician stated agreement with plan or made changes to plan which were implemented.    Final Clinical Impression(s) / ED Diagnoses Final  diagnoses:  Chest pain, unspecified type  Acute nonintractable headache, unspecified headache type  Dehydration    Rx / DC Orders ED Discharge Orders     None         Olene Floss, PA-C 09/24/22 2217    Elayne Snare K, DO 09/24/22 2247

## 2022-09-24 NOTE — ED Notes (Signed)
Patient verbalizes understanding of discharge instructions. Opportunity for questioning and answers were provided. Armband removed by staff, pt discharged from ED. Pt ambulatory to ED waiting room with steady gait.  

## 2022-11-25 ENCOUNTER — Ambulatory Visit (INDEPENDENT_AMBULATORY_CARE_PROVIDER_SITE_OTHER): Payer: BC Managed Care – PPO | Admitting: Family Medicine

## 2022-11-25 ENCOUNTER — Telehealth: Payer: Self-pay

## 2022-11-25 ENCOUNTER — Encounter: Payer: Self-pay | Admitting: Family Medicine

## 2022-11-25 VITALS — BP 126/78 | HR 82 | Temp 98.5°F | Ht 66.0 in | Wt 241.0 lb

## 2022-11-25 DIAGNOSIS — E669 Obesity, unspecified: Secondary | ICD-10-CM | POA: Diagnosis not present

## 2022-11-25 DIAGNOSIS — Z6838 Body mass index (BMI) 38.0-38.9, adult: Secondary | ICD-10-CM

## 2022-11-25 DIAGNOSIS — R0681 Apnea, not elsewhere classified: Secondary | ICD-10-CM | POA: Diagnosis not present

## 2022-11-25 DIAGNOSIS — R7303 Prediabetes: Secondary | ICD-10-CM | POA: Insufficient documentation

## 2022-11-25 DIAGNOSIS — Z23 Encounter for immunization: Secondary | ICD-10-CM

## 2022-11-25 DIAGNOSIS — J310 Chronic rhinitis: Secondary | ICD-10-CM

## 2022-11-25 MED ORDER — SEMAGLUTIDE-WEIGHT MANAGEMENT 1.7 MG/0.75ML ~~LOC~~ SOAJ
1.7000 mg | SUBCUTANEOUS | 0 refills | Status: DC
Start: 2023-02-20 — End: 2022-11-25

## 2022-11-25 MED ORDER — SEMAGLUTIDE-WEIGHT MANAGEMENT 1.7 MG/0.75ML ~~LOC~~ SOAJ
1.70 mg | SUBCUTANEOUS | 0 refills | Status: AC
Start: 2023-02-20 — End: 2023-03-20

## 2022-11-25 MED ORDER — SEMAGLUTIDE-WEIGHT MANAGEMENT 0.5 MG/0.5ML ~~LOC~~ SOAJ
0.5000 mg | SUBCUTANEOUS | 0 refills | Status: AC
Start: 2022-12-24 — End: 2023-01-21

## 2022-11-25 MED ORDER — SEMAGLUTIDE-WEIGHT MANAGEMENT 2.4 MG/0.75ML ~~LOC~~ SOAJ
2.4000 mg | SUBCUTANEOUS | 0 refills | Status: AC
Start: 2023-03-21 — End: 2023-04-18

## 2022-11-25 MED ORDER — SEMAGLUTIDE-WEIGHT MANAGEMENT 1 MG/0.5ML ~~LOC~~ SOAJ
1.00 mg | SUBCUTANEOUS | 0 refills | Status: DC
Start: 2023-01-22 — End: 2022-11-25

## 2022-11-25 MED ORDER — SEMAGLUTIDE-WEIGHT MANAGEMENT 1 MG/0.5ML ~~LOC~~ SOAJ
1.0000 mg | SUBCUTANEOUS | 0 refills | Status: AC
Start: 2023-01-22 — End: 2023-02-19

## 2022-11-25 MED ORDER — SEMAGLUTIDE-WEIGHT MANAGEMENT 0.5 MG/0.5ML ~~LOC~~ SOAJ
0.5000 mg | SUBCUTANEOUS | 0 refills | Status: DC
Start: 2022-12-24 — End: 2022-11-25

## 2022-11-25 MED ORDER — FLUTICASONE PROPIONATE 50 MCG/ACT NA SUSP
2.0000 | Freq: Every day | NASAL | 6 refills | Status: DC
Start: 2022-11-25 — End: 2024-01-05

## 2022-11-25 MED ORDER — SEMAGLUTIDE-WEIGHT MANAGEMENT 0.25 MG/0.5ML ~~LOC~~ SOAJ
0.25 mg | SUBCUTANEOUS | 0 refills | Status: AC
Start: 2022-11-25 — End: 2022-12-23

## 2022-11-25 MED ORDER — SEMAGLUTIDE-WEIGHT MANAGEMENT 0.25 MG/0.5ML ~~LOC~~ SOAJ
0.25 mg | SUBCUTANEOUS | 0 refills | Status: DC
Start: 2022-11-25 — End: 2022-11-25

## 2022-11-25 MED ORDER — SEMAGLUTIDE-WEIGHT MANAGEMENT 2.4 MG/0.75ML ~~LOC~~ SOAJ
2.4000 mg | SUBCUTANEOUS | 0 refills | Status: DC
Start: 2023-03-21 — End: 2022-11-25

## 2022-11-25 NOTE — Telephone Encounter (Signed)
PA initiated via Covermymeds; KEY: [id number]. Awaiting determination.

## 2022-11-25 NOTE — Progress Notes (Signed)
Chief Complaint  Patient presents with   New Patient (Initial Visit)    Headaches towards the back of head.  He thinks may have been dehydration Blood pressure high       New Patient Visit SUBJECTIVE: HPI: Robert Copeland is an 32 y.o.male who is being seen for establishing care.  Headaches Last 2 weeks having daily headaches. Associated R sided chest pain. Took ibuprofen with some relief. He did feel some pressure in his R eye.  He has associated runny nose/congestion.  Taking his contacts out would provide mild relief. No vision changes, difficulty swallowing, trouble w speech, balance issues, weakness, N/V, neck/jaw pain. No hx of migraines.   High blood pressure He does monitor home blood pressures. Blood pressures ranging on average from 140's/80-90's. He is not on medications. He is not routinely adhering to a healthy diet overall. Exercise: active at work He does snore at night. Has been told he stops breathing at night and that he needs a sleep study.  +famhx of high BP in mom.  No current CP or SOB.   Obesity Has not tried anything for weight loss. Continues to gain. Interested in wt loss medication. Diet/exercise as above.   Past Medical History:  Diagnosis Date   Prediabetes    Past Surgical History:  Procedure Laterality Date   NO PAST SURGERIES     Family History  Problem Relation Age of Onset   Hypertension Mother    Healthy Father    Cancer Neg Hx    Heart disease Neg Hx    Diabetes Neg Hx    No Known Allergies  Takes no meds routinely.   OBJECTIVE: BP 126/78 (BP Location: Left Arm, Cuff Size: Large)   Pulse 82   Temp 98.5 F (36.9 C) (Oral)   Ht 5\' 6"  (1.676 m)   Wt 241 lb (109.3 kg)   SpO2 98%   BMI 38.90 kg/m  General:  well developed, well nourished, in no apparent distress Skin:  no significant moles, warts, or growths Nose:  nares patent, septum midline, mucosa normal Throat/Pharynx:  lips and gingiva without lesion; tongue and uvula  midline; non-inflamed pharynx; no exudates or postnasal drainage; Mallampati 4 Lungs:  clear to auscultation, breath sounds equal bilaterally, no respiratory distress Cardio:  regular rate and rhythm, no LE edema or bruits Musculoskeletal: No TTP of the suboccipital triangle, cervical paraspinal musculature, TMJ, temporalis region, or cervical midline Neuro:  gait normal, DTRs equal and symmetric throughout, 5/5 strength throughout, gait is normal Psych: well oriented with normal range of affect and appropriate judgment/insight  ASSESSMENT/PLAN: Obesity (BMI 30-39.9) - Plan: Semaglutide-Weight Management 0.25 MG/0.5ML SOAJ, Semaglutide-Weight Management 0.5 MG/0.5ML SOAJ, Semaglutide-Weight Management 1 MG/0.5ML SOAJ, Semaglutide-Weight Management 1.7 MG/0.75ML SOAJ, Semaglutide-Weight Management 2.4 MG/0.75ML SOAJ, DISCONTINUED: Semaglutide-Weight Management 0.25 MG/0.5ML SOAJ, DISCONTINUED: Semaglutide-Weight Management 0.5 MG/0.5ML SOAJ, DISCONTINUED: Semaglutide-Weight Management 1 MG/0.5ML SOAJ, DISCONTINUED: Semaglutide-Weight Management 1.7 MG/0.75ML SOAJ, DISCONTINUED: Semaglutide-Weight Management 2.4 MG/0.75ML SOAJ  Witnessed episode of apnea - Plan: Ambulatory referral to Neurology  Rhinitis, unspecified type - Plan: fluticasone (FLONASE) 50 MCG/ACT nasal spray  Need for Tdap vaccination - Plan: Tdap vaccine greater than or equal to 7yo IM  Chronic, unstable.  Start Wegovy 0.25 mg weekly and steadily uptitrate.  Counseled on diet and exercise.  Follow-up in 6 weeks to recheck this. Refer to neurology for sleep evaluation. He will trial Flonase daily again. Tdap updated today. The patient voiced understanding and agreement to the plan.   Jilda Roche [place],  DO 11/25/22  11:36 AM

## 2022-11-25 NOTE — Telephone Encounter (Signed)
Patient informed of approval.  

## 2022-11-25 NOTE — Telephone Encounter (Signed)
PA approved.   UEAVWU:[id number];WGNFAO:[id number];Review Type:Prior Auth;Coverage Start Date:10/26/2022;Coverage End Date:06/23/2023;

## 2022-11-25 NOTE — Patient Instructions (Addendum)
Check your blood pressures 2-3 times per week, alternating the time of day you check it. If it is high, considering waiting 1-2 minutes and rechecking. If it gets higher, your anxiety is likely creeping up and we should avoid rechecking.   Keep the diet clean and stay active.  Aim to do some physical exertion for 150 minutes per week. This is typically divided into 5 days per week, 30 minutes per day. The activity should be enough to get your heart rate up. Anything is better than nothing if you have time constraints.  If you do not hear anything about your referral in the next 1-2 weeks, call our office and ask for an update.  Let me know if there are cost/supply issues with the injection.   Let us know if you need anything.

## 2022-12-12 DIAGNOSIS — Z113 Encounter for screening for infections with a predominantly sexual mode of transmission: Secondary | ICD-10-CM | POA: Diagnosis not present

## 2022-12-18 ENCOUNTER — Encounter (INDEPENDENT_AMBULATORY_CARE_PROVIDER_SITE_OTHER): Payer: Self-pay

## 2022-12-23 ENCOUNTER — Encounter: Payer: Self-pay | Admitting: Family Medicine

## 2022-12-24 ENCOUNTER — Encounter: Payer: Self-pay | Admitting: Family Medicine

## 2022-12-24 ENCOUNTER — Telehealth (INDEPENDENT_AMBULATORY_CARE_PROVIDER_SITE_OTHER): Payer: BC Managed Care – PPO | Admitting: Family Medicine

## 2022-12-24 DIAGNOSIS — U071 COVID-19: Secondary | ICD-10-CM | POA: Diagnosis not present

## 2022-12-24 MED ORDER — PROMETHAZINE-DM 6.25-15 MG/5ML PO SYRP
5.0000 mL | ORAL_SOLUTION | Freq: Four times a day (QID) | ORAL | 0 refills | Status: DC | PRN
Start: 2022-12-24 — End: 2024-01-05

## 2022-12-24 NOTE — Progress Notes (Signed)
Chief Complaint  Patient presents with   Headache   Generalized Body Aches   Covid Positive    Tested on Saturday and was positive Fever on and off Slight cough Taking OTC medications for symptoms    ROWAN BOUWKAMP here for URI complaints. We are interacting via web portal for an electronic face-to-face visit. I verified patient's ID using 2 identifiers. Patient agreed to proceed with visit via this method. Patient is at home, I am at office. Patient and I are present for visit.   Duration: 5 days  Associated symptoms: Fever (100.4 F max), sinus congestion, myalgia, and slight cough, salty taste Denies: sinus pain, rhinorrhea, itchy watery eyes, ear pain, ear drainage, sore throat, wheezing, shortness of breath, and N/V/D, loss of smell Treatment to date: Ibuprofen, Vit c, MV, Dayquil, Nyquil, Mucinex Sick contacts: No Tested + for covid on 12/21/22  Past Medical History:  Diagnosis Date   Prediabetes     Objective No conversational dyspnea Age appropriate judgment and insight Nml affect and mood  COVID-19 - Plan: promethazine-dextromethorphan (PROMETHAZINE-DM) 6.25-15 MG/5ML syrup  Continue to push fluids, practice good hand hygiene, cover mouth when coughing. CDC quarantining guidelines discussed.  Letter for work provided excusing from 8/4-8/11. F/u prn. If starting to experience irreplaceable fluid loss, shaking, or shortness of breath, seek immediate care. Pt voiced understanding and agreement to the plan.  Jilda Roche [place], DO 12/24/22 8:52 AM

## 2023-01-08 ENCOUNTER — Ambulatory Visit: Payer: BC Managed Care – PPO | Admitting: Family Medicine

## 2023-07-02 ENCOUNTER — Encounter: Payer: Self-pay | Admitting: Family Medicine

## 2024-01-05 ENCOUNTER — Ambulatory Visit (INDEPENDENT_AMBULATORY_CARE_PROVIDER_SITE_OTHER): Admitting: Family Medicine

## 2024-01-05 VITALS — BP 128/80 | HR 79 | Temp 98.0°F | Resp 16 | Ht 66.0 in | Wt 225.0 lb

## 2024-01-05 DIAGNOSIS — Z1159 Encounter for screening for other viral diseases: Secondary | ICD-10-CM

## 2024-01-05 DIAGNOSIS — Z114 Encounter for screening for human immunodeficiency virus [HIV]: Secondary | ICD-10-CM | POA: Diagnosis not present

## 2024-01-05 DIAGNOSIS — Z0000 Encounter for general adult medical examination without abnormal findings: Secondary | ICD-10-CM | POA: Diagnosis not present

## 2024-01-05 NOTE — Progress Notes (Signed)
 Chief Complaint  Patient presents with   Annual Exam    CPE    Well Male Robert Copeland is here for a complete physical.   His last physical was >1 year ago.  Current diet: in general, a healthy diet.   Current exercise: walking, active at work Weight trend: intentionally losing Fatigue out of ordinary? No. Seat belt? Yes.   Advanced directive? No  Health maintenance Tetanus- Yes HIV- No Hep C- No  Past Medical History:  Diagnosis Date   Prediabetes      Past Surgical History:  Procedure Laterality Date   NO PAST SURGERIES      Medications  Takes no meds routinely.   Allergies No Known Allergies  Family History Family History  Problem Relation Age of Onset   Hypertension Mother    Healthy Father    Cancer Neg Hx    Heart disease Neg Hx    Diabetes Neg Hx     Review of Systems: Constitutional: no fevers or chills Eye:  no recent significant change in vision Ear/Nose/Mouth/Throat:  Ears:  no hearing loss Nose/Mouth/Throat:  no complaints of nasal congestion, no sore throat Cardiovascular:  no chest pain Respiratory:  no shortness of breath Gastrointestinal:  no abdominal pain, no change in bowel habits GU:  Male: negative for dysuria Musculoskeletal/Extremities:  no pain of the joints Integumentary (Skin/Breast):  no abnormal skin lesions reported Neurologic:  no headaches Endocrine: No unexpected weight changes Hematologic/Lymphatic:  no night sweats  Exam BP 128/80 (BP Location: Left Arm, Patient Position: Sitting)   Pulse 79   Temp 98 F (36.7 C) (Oral)   Resp 16   Ht 5' 6 (1.676 m)   Wt 225 lb (102.1 kg)   SpO2 98%   BMI 36.32 kg/m  General:  well developed, well nourished, in no apparent distress Skin:  no significant moles, warts, or growths Head:  no masses, lesions, or tenderness Eyes:  pupils equal and round, sclera anicteric without injection Ears:  canals without lesions, TMs shiny without retraction, no obvious effusion, no  erythema Nose:  nares patent, mucosa normal Throat/Pharynx:  lips and gingiva without lesion; tongue and uvula midline; non-inflamed pharynx; no exudates or postnasal drainage Neck: neck supple without adenopathy, thyromegaly, or masses Lungs:  clear to auscultation, breath sounds equal bilaterally, no respiratory distress Cardio:  regular rate and rhythm, no bruits, no LE edema Abdomen:  abdomen soft, nontender; bowel sounds normal; no masses or organomegaly Genital (male): Deferred Rectal: Deferred Musculoskeletal:  symmetrical muscle groups noted without atrophy or deformity Extremities:  no clubbing, cyanosis, or edema, no deformities, no skin discoloration Neuro:  gait normal; deep tendon reflexes normal and symmetric Psych: well oriented with normal range of affect and appropriate judgment/insight  Assessment and Plan  Well adult exam - Plan: CBC, Comprehensive metabolic panel with GFR, Lipid panel  Encounter for hepatitis C screening test for low risk patient - Plan: Hepatitis C antibody  Screening for HIV without presence of risk factors - Plan: HIV Antibody (routine testing w rflx)   Well 33 y.o. male. Counseled on diet and exercise. Self testicular exams recommended at least monthly.  Advanced directive form provided today.  Some phlegm, feels like it is sitting in chest, a/w meals. Will get him on some pepcid  and go from there. Consider INCS.  HIV, Hep C screening.  Other orders as above. Follow up in 1 year pending the above workup. The patient voiced understanding and agreement to the plan.  Mabel [place], DO 01/05/24 2:51 PM

## 2024-01-05 NOTE — Patient Instructions (Addendum)
 Give us  2-3 business days to get the results of your labs back.   Keep the diet clean and stay active.  Please get me a copy of your advanced directive form at your convenience.   Do monthly self testicular checks in the shower. You are feeling for lumps/bumps that don't belong. If you feel anything like this, let me know!  I recommend getting the flu shot in mid October. This suggestion would change if the CDC comes out with a different recommendation.   Pepcid /famotidine  20 mg 1-2 times daily can help with reflux symptoms.   Flonase  can help with drainage.   Let us  know if you need anything.  Pectoralis Major Rehab Ask your health care provider which exercises are safe for you. Do exercises exactly as told by your health care provider and adjust them as directed. It is normal to feel mild stretching, pulling, tightness, or discomfort as you do these exercises, but you should stop right away if you feel sudden pain or your pain gets worse. Do not begin these exercises until told by your health care provider. Stretching and range of motion exercises These exercises warm up your muscles and joints and improve the movement and flexibility of your shoulder. These exercises can also help to relieve pain, numbness, and tingling. Exercise A: Pendulum  Stand near a wall or a surface that you can hold onto for balance. Bend at the waist and let your left / right arm hang straight down. Use your other arm to keep your balance. Relax your arm and shoulder muscles, and move your hips and your trunk so your left / right arm swings freely. Your arm should swing because of the motion of your body, not because you are using your arm or shoulder muscles. Keep moving so your arm swings in the following directions, as told by your health care provider: Side to side. Forward and backward. In clockwise and counterclockwise circles. Slowly return to the starting position. Repeat 2 times. Complete this  exercise 3 times per week. Exercise B: Abduction, standing Stand and hold a broomstick, a cane, or a similar object. Place your hands a little more than shoulder-width apart on the object. Your left / right hand should be palm-up, and your other hand should be palm-down. While keeping your elbow straight and your shoulder muscles relaxed, push the stick across your body toward your left / right side. Raise your left / right arm to the side of your body and then over your head until you feel a stretch in your shoulder. Stop when you reach the angle that is recommended by your health care provider. Avoid shrugging your shoulder while you raise your arm. Keep your shoulder blade tucked down toward the middle of your spine. Hold for 10 seconds. Slowly return to the starting position. Repeat 2 times. Complete this exercise 3 times per week. Exercise C: Wand flexion, supine  Lie on your back. You may bend your knees for comfort. Hold a broomstick, a cane, or a similar object so that your hands are about shoulder-width apart on the object. Your palms should face toward your feet. Raise your left / right arm in front of your face, then behind your head (toward the floor). Use your other hand to help you do this. Stop when you feel a gentle stretch in your shoulder, or when you reach the angle that is recommended by your health care provider. Hold for 3 seconds. Use the broomstick and your other arm  to help you return your left / right arm to the starting position. Repeat 2 times. Complete this exercise 3 times per week. Exercise D: Wand shoulder external rotation Stand and hold a broomstick, a cane, or a similar object so your hands are about shoulder-width apart on the object. Start with your arms hanging down, then bend both elbows to an L shape (90 degrees). Keep your left / right elbow at your side. Use your other hand to push the stick so your left / right forearm moves away from your body, out to  your side. Keep your left / right elbow bent to 90 degrees and keep it against your side. Stop when you feel a gentle stretch in your shoulder, or when you reach the angle recommended by your health care provider. Hold for 10 seconds. Use the stick to help you return your left / right arm to the starting position. Repeat 2 times. Complete this exercise 3 times per week. Strengthening exercises These exercises build strength and endurance in your shoulder. Endurance is the ability to use your muscles for a long time, even after your muscles get tired. Exercise E: Scapular protraction, standing Stand so you are facing a wall. Place your feet about one arm-length away from the wall. Place your hands on the wall and straighten your elbows. Keep your hands on the wall as you push your upper back away from the wall. You should feel your shoulder blades sliding forward. Keep your elbows and your head still. If you are not sure that you are doing this exercise correctly, ask your health care provider for more instructions. Hold for 3 seconds. Slowly return to the starting position. Let your muscles relax completely before you repeat this exercise. Repeat 2 times. Complete this exercise 3 times per week. Exercise F: Shoulder blade squeezes  (scapular retraction) Sit with good posture in a stable chair. Do not let your back touch the back of the chair. Your arms should be at your sides with your elbows bent. You may rest your forearms on a pillow if that is more comfortable. Squeeze your shoulder blades together. Bring them down and back. Keep your shoulders level. Do not lift your shoulders up toward your ears. Hold for 3 seconds. Return to the starting position. Repeat 2 times. Complete this exercise 3 times per week. This information is not intended to replace advice given to you by your health care provider. Make sure you discuss any questions you have with your health care provider. Document  Released: 05/06/2005 Document Revised: 02/15/2016 Document Reviewed: 01/22/2015 Elsevier Interactive Patient Education  [employer].

## 2024-01-06 ENCOUNTER — Ambulatory Visit: Payer: Self-pay | Admitting: Family Medicine

## 2024-01-06 LAB — COMPREHENSIVE METABOLIC PANEL WITH GFR
ALT: 36 U/L (ref 0–53)
AST: 24 U/L (ref 0–37)
Albumin: 4.6 g/dL (ref 3.5–5.2)
Alkaline Phosphatase: 44 U/L (ref 39–117)
BUN: 12 mg/dL (ref 6–23)
CO2: 26 meq/L (ref 19–32)
Calcium: 8.9 mg/dL (ref 8.4–10.5)
Chloride: 103 meq/L (ref 96–112)
Creatinine, Ser: 1.28 mg/dL (ref 0.40–1.50)
GFR: 73.9 mL/min (ref >60.00)
Glucose, Bld: 87 mg/dL (ref 70–99)
Potassium: 4.1 meq/L (ref 3.5–5.1)
Sodium: 138 meq/L (ref 135–145)
Total Bilirubin: 0.5 mg/dL (ref 0.2–1.2)
Total Protein: 7.2 g/dL (ref 6.0–8.3)

## 2024-01-06 LAB — LIPID PANEL
Cholesterol: 183 mg/dL (ref 0–200)
HDL: 47.2 mg/dL (ref >39.00)
LDL Cholesterol: 100 mg/dL — ABNORMAL HIGH (ref 0–99)
NonHDL: 135.77
Total CHOL/HDL Ratio: 4
Triglycerides: 177 mg/dL — ABNORMAL HIGH (ref 0.0–149.0)
VLDL: 35.4 mg/dL (ref 0.0–40.0)

## 2024-01-06 LAB — CBC
HCT: 47.5 % (ref 39.0–52.0)
Hemoglobin: 15.4 g/dL (ref 13.0–17.0)
MCHC: 32.4 g/dL (ref 30.0–36.0)
MCV: 84.7 fl (ref 78.0–100.0)
Platelets: 233 K/uL (ref 150.0–400.0)
RBC: 5.61 Mil/uL (ref 4.22–5.81)
RDW: 13.9 % (ref 11.5–15.5)
WBC: 6.5 K/uL (ref 4.0–10.5)

## 2024-01-06 LAB — HIV ANTIBODY (ROUTINE TESTING W REFLEX): HIV 1&2 Ab, 4th Generation: NONREACTIVE

## 2024-01-06 LAB — HEPATITIS C ANTIBODY: Hepatitis C Ab: NONREACTIVE
